# Patient Record
Sex: Male | Born: 1965
Health system: Southern US, Community
[De-identification: ages and names within clinical notes are randomized; demographics above are authoritative.]

## PROBLEM LIST (undated history)

## (undated) DIAGNOSIS — C189 Malignant neoplasm of colon, unspecified: Secondary | ICD-10-CM

## (undated) DIAGNOSIS — E785 Hyperlipidemia, unspecified: Secondary | ICD-10-CM

---

## 2013-06-22 ENCOUNTER — Ambulatory Visit (INDEPENDENT_AMBULATORY_CARE_PROVIDER_SITE_OTHER): Payer: Managed Care, Other (non HMO) | Admitting: Sports Medicine

## 2013-06-22 ENCOUNTER — Ambulatory Visit (INDEPENDENT_AMBULATORY_CARE_PROVIDER_SITE_OTHER): Payer: Managed Care, Other (non HMO)

## 2013-06-22 ENCOUNTER — Encounter: Payer: Self-pay | Admitting: Sports Medicine

## 2013-06-22 VITALS — BP 145/92 | HR 81 | Wt 218.0 lb

## 2013-06-22 DIAGNOSIS — Z0000 Encounter for general adult medical examination without abnormal findings: Secondary | ICD-10-CM

## 2013-06-22 DIAGNOSIS — M25561 Pain in right knee: Secondary | ICD-10-CM

## 2013-06-22 DIAGNOSIS — J45909 Unspecified asthma, uncomplicated: Secondary | ICD-10-CM | POA: Insufficient documentation

## 2013-06-22 DIAGNOSIS — M25569 Pain in unspecified knee: Secondary | ICD-10-CM

## 2013-06-22 DIAGNOSIS — Z299 Encounter for prophylactic measures, unspecified: Secondary | ICD-10-CM

## 2013-06-22 DIAGNOSIS — M1710 Unilateral primary osteoarthritis, unspecified knee: Secondary | ICD-10-CM

## 2013-06-22 DIAGNOSIS — Z23 Encounter for immunization: Secondary | ICD-10-CM

## 2013-06-22 NOTE — Assessment & Plan Note (Signed)
X-rays, continue Aleve, on rehabilitation. Return as needed for this.

## 2013-06-22 NOTE — Assessment & Plan Note (Signed)
Tetanus and flu given today. Return for complete physical. Next line checking routine blood work.

## 2013-06-22 NOTE — Progress Notes (Signed)
  Subjective:    CC: Establish care.   HPI:  This is a very healthy 47 year old male, comes here to establish care. He does need a complete physical, this should include biometric screening and is for his work.  Bilateral knee pain: Localized in the anterior knees, aching, no morning stiffness, mild, persistent.  Preventive measure: Due for flu, and Tdap injections.  Past medical history, Surgical history, Family history not pertinant except as noted below, Social history, Allergies, and medications have been entered into the medical record, reviewed, and no changes needed.   Review of Systems: No headache, visual changes, nausea, vomiting, diarrhea, constipation, dizziness, abdominal pain, skin rash, fevers, chills, night sweats, swollen lymph nodes, weight loss, chest pain, body aches, joint swelling, muscle aches, shortness of breath, mood changes, visual or auditory hallucinations.  Objective:    General: Well Developed, well nourished, and in no acute distress.  Neuro: Alert and oriented x3, extra-ocular muscles intact, sensation grossly intact.  HEENT: Normocephalic, atraumatic, pupils equal round reactive to light, neck supple, no masses, no lymphadenopathy, thyroid nonpalpable.  Skin: Warm and dry, no rashes noted.  Cardiac: Regular rate and rhythm, no murmurs rubs or gallops.  Respiratory: Clear to auscultation bilaterally. Not using accessory muscles, speaking in full sentences.  Abdominal: Soft, nontender, nondistended, positive bowel sounds, no masses, no organomegaly.  Bilateral Knee: Normal to inspection with no erythema or effusion or obvious bony abnormalities. Palpation normal with no warmth, joint line tenderness, patellar tenderness, or condyle tenderness. ROM full in flexion and extension and lower leg rotation. Ligaments with solid consistent endpoints including ACL, PCL, LCL, MCL. Negative Mcmurray's, Apley's, and Thessalonian tests. Non painful patellar  compression. Patellar glide without crepitus. Patellar and quadriceps tendons unremarkable. Hamstring and quadriceps strength is normal.   X-rays were personally reviewed, there is mild bilateral patellofemoral, and tibiofemoral DJD.  Impression and Recommendations:    The patient was counselled, risk factors were discussed, anticipatory guidance given.

## 2013-06-22 NOTE — Assessment & Plan Note (Signed)
Uses Advair and albuterol but only during allergy season. Asymptomatic the rest of the year.

## 2013-07-02 LAB — COMPREHENSIVE METABOLIC PANEL
ALT: 47 U/L (ref 0–53)
CO2: 28 mEq/L (ref 19–32)
Calcium: 8.9 mg/dL (ref 8.4–10.5)
Chloride: 102 mEq/L (ref 96–112)
Creat: 0.99 mg/dL (ref 0.50–1.35)
Glucose, Bld: 95 mg/dL (ref 70–99)
Sodium: 136 mEq/L (ref 135–145)
Total Protein: 6.9 g/dL (ref 6.0–8.3)

## 2013-07-02 LAB — LIPID PANEL
Cholesterol: 155 mg/dL (ref 0–200)
HDL: 37 mg/dL — ABNORMAL LOW (ref >39)
LDL Cholesterol: 102 mg/dL — ABNORMAL HIGH (ref 0–99)
Total CHOL/HDL Ratio: 4.2 Ratio
Triglycerides: 80 mg/dL (ref <150)
VLDL: 16 mg/dL (ref 0–40)

## 2013-07-02 LAB — COMPREHENSIVE METABOLIC PANEL WITH GFR
AST: 49 U/L — ABNORMAL HIGH (ref 0–37)
Albumin: 4.1 g/dL (ref 3.5–5.2)
Alkaline Phosphatase: 45 U/L (ref 39–117)
BUN: 13 mg/dL (ref 6–23)
Potassium: 4.3 meq/L (ref 3.5–5.3)
Total Bilirubin: 0.6 mg/dL (ref 0.3–1.2)

## 2013-07-02 LAB — CBC
HCT: 42.7 % (ref 39.0–52.0)
Hemoglobin: 15 g/dL (ref 13.0–17.0)
MCH: 30.9 pg (ref 26.0–34.0)
MCHC: 35.1 g/dL (ref 30.0–36.0)
MCV: 87.9 fL (ref 78.0–100.0)
Platelets: 240 10*3/uL (ref 150–400)
RBC: 4.86 MIL/uL (ref 4.22–5.81)
RDW: 13.8 % (ref 11.5–15.5)
WBC: 4.6 10*3/uL (ref 4.0–10.5)

## 2013-07-02 LAB — TSH: TSH: 1.351 u[IU]/mL (ref 0.350–4.500)

## 2013-07-03 ENCOUNTER — Ambulatory Visit (INDEPENDENT_AMBULATORY_CARE_PROVIDER_SITE_OTHER): Payer: Managed Care, Other (non HMO) | Admitting: Sports Medicine

## 2013-07-03 ENCOUNTER — Encounter: Payer: Self-pay | Admitting: Sports Medicine

## 2013-07-03 VITALS — BP 133/85 | HR 77 | Wt 217.0 lb

## 2013-07-03 DIAGNOSIS — Z0000 Encounter for general adult medical examination without abnormal findings: Secondary | ICD-10-CM

## 2013-07-03 DIAGNOSIS — M25561 Pain in right knee: Secondary | ICD-10-CM

## 2013-07-03 DIAGNOSIS — J45909 Unspecified asthma, uncomplicated: Secondary | ICD-10-CM

## 2013-07-03 DIAGNOSIS — Z299 Encounter for prophylactic measures, unspecified: Secondary | ICD-10-CM

## 2013-07-03 DIAGNOSIS — M25569 Pain in unspecified knee: Secondary | ICD-10-CM

## 2013-07-03 NOTE — Assessment & Plan Note (Signed)
Complete physical performed today. Lipids look good. Up-to-date on tetanus and flu vaccination. Biometric screening form filled out today.

## 2013-07-03 NOTE — Progress Notes (Signed)
  Subjective:    CC: Complete physical  HPI:  Preventive measure: Jared Singleton is up-to-date on all preventive measures including vaccinations. He is here for a complete physical exam. He does have a work by records form that needs to be filled out.  Knee pain: Stable with naproxen, has not yet started exercises.  Allergic asthma colon on Advair and albuterol, does not need any refills.  Past medical history, Surgical history, Family history not pertinant except as noted below, Social history, Allergies, and medications have been entered into the medical record, reviewed, and no changes needed.   Review of Systems: No headache, visual changes, nausea, vomiting, diarrhea, constipation, dizziness, abdominal pain, skin rash, fevers, chills, night sweats, swollen lymph nodes, weight loss, chest pain, body aches, joint swelling, muscle aches, shortness of breath, mood changes, visual or auditory hallucinations.  Objective:    General: Well Developed, well nourished, and in no acute distress.  Neuro: Alert and oriented x3, extra-ocular muscles intact, sensation grossly intact.  HEENT: Normocephalic, atraumatic, pupils equal round reactive to light, neck supple, no masses, no lymphadenopathy, thyroid nonpalpable. Oropharynx, nasopharynx, external ear canals are unremarkable. Skin: Warm and dry, no rashes noted.  Cardiac: Regular rate and rhythm, no murmurs rubs or gallops.  Respiratory: Clear to auscultation bilaterally. Not using accessory muscles, speaking in full sentences.  Abdominal: Soft, nontender, nondistended, positive bowel sounds, no masses, no organomegaly.  Musculoskeletal: Shoulder, elbow, wrist, hip, knee, ankle stable, and with full range of motion. Impression and Recommendations:    The patient was counselled, risk factors were discussed, anticipatory guidance given.

## 2013-07-03 NOTE — Assessment & Plan Note (Signed)
Well controlled. He is on Advair and albuterol, he will let us know his dosages when he is ready for refills.

## 2013-07-03 NOTE — Assessment & Plan Note (Signed)
Continue Aleve. Home exercises. He did have mild patellofemoral DJD on x-ray.

## 2013-12-11 ENCOUNTER — Encounter: Payer: Self-pay | Admitting: Sports Medicine

## 2013-12-11 ENCOUNTER — Ambulatory Visit (INDEPENDENT_AMBULATORY_CARE_PROVIDER_SITE_OTHER): Payer: BC Managed Care – PPO | Admitting: Sports Medicine

## 2013-12-11 VITALS — BP 131/83 | HR 73 | Ht 69.0 in | Wt 215.0 lb

## 2013-12-11 DIAGNOSIS — M25562 Pain in left knee: Secondary | ICD-10-CM

## 2013-12-11 DIAGNOSIS — Z299 Encounter for prophylactic measures, unspecified: Secondary | ICD-10-CM

## 2013-12-11 DIAGNOSIS — M25561 Pain in right knee: Secondary | ICD-10-CM

## 2013-12-11 DIAGNOSIS — M25569 Pain in unspecified knee: Secondary | ICD-10-CM

## 2013-12-11 DIAGNOSIS — R55 Syncope and collapse: Secondary | ICD-10-CM

## 2013-12-11 DIAGNOSIS — J45909 Unspecified asthma, uncomplicated: Secondary | ICD-10-CM

## 2013-12-11 MED ORDER — ALBUTEROL SULFATE HFA 108 (90 BASE) MCG/ACT IN AERS: 2.0000 | INHALATION_SPRAY | Freq: Four times a day (QID) | RESPIRATORY_TRACT | Status: DC | PRN

## 2013-12-11 MED ORDER — FLUTICASONE PROPIONATE HFA 110 MCG/ACT IN AERO: 2.0000 | INHALATION_SPRAY | Freq: Two times a day (BID) | RESPIRATORY_TRACT | Status: DC

## 2013-12-11 NOTE — Assessment & Plan Note (Signed)
This is likely benign and self-limited. He is able to run for 30 minutes on a treadmill without any cardiac or vascular symptoms. Return as needed.

## 2013-12-11 NOTE — Assessment & Plan Note (Signed)
Well controlled on Flovent, hasn't had to use any albuterol.

## 2013-12-11 NOTE — Assessment & Plan Note (Signed)
Return as needed for this, this is stable. He can come back for custom orthotics should he desire.

## 2013-12-11 NOTE — Assessment & Plan Note (Signed)
Return in November for complete physical.

## 2013-12-11 NOTE — Progress Notes (Signed)
  Subjective:    CC: Followup  HPI: Asthma: Well controlled on Flovent.  Knee pain: Overall well controlled with NSAIDs.  Vagal attack: Some time ago had an episode of presyncope, clamminess, nausea, this resolved quickly. He does run 30 minutes of treadmill and denies any chest pain, palpitations, or presyncopal symptoms. This has not happened again.  Past medical history, Surgical history, Family history not pertinant except as noted below, Social history, Allergies, and medications have been entered into the medical record, reviewed, and no changes needed.   Review of Systems: No fevers, chills, night sweats, weight loss, chest pain, or shortness of breath.   Objective:    General: Well Developed, well nourished, and in no acute distress.  Neuro: Alert and oriented x3, extra-ocular muscles intact, sensation grossly intact.  HEENT: Normocephalic, atraumatic, pupils equal round reactive to light, neck supple, no masses, no lymphadenopathy, thyroid nonpalpable.  Skin: Warm and dry, no rashes. Cardiac: Regular rate and rhythm, no murmurs rubs or gallops, no lower extremity edema.  Respiratory: Clear to auscultation bilaterally. Not using accessory muscles, speaking in full sentences.  Impression and Recommendations:

## 2013-12-11 NOTE — Patient Instructions (Signed)
Vasovagal Syncope, Adult  Syncope, commonly known as fainting, is a temporary loss of consciousness. It occurs when the blood flow to the brain is reduced. Vasovagal syncope (also called neurocardiogenic syncope) is a fainting spell in which the blood flow to the brain is reduced because of a sudden drop in heart rate and blood pressure. Vasovagal syncope occurs when the brain and the cardiovascular system (blood vessels) do not adequately communicate and respond to each other. This is the most common cause of fainting. It often occurs in response to fear or some other type of emotional or physical stress. The body has a reaction in which the heart starts beating too slowly or the blood vessels expand, reducing blood pressure. This type of fainting spell is generally considered harmless. However, injuries can occur if a person takes a sudden fall during a fainting spell.   CAUSES   Vasovagal syncope occurs when a person's blood pressure and heart rate decrease suddenly, usually in response to a trigger. Many things and situations can trigger an episode. Some of these include:   · Pain.    · Fear.    · The sight of blood or medical procedures, such as blood being drawn from a vein.    · Common activities, such as coughing, swallowing, stretching, or going to the bathroom.    · Emotional stress.    · Prolonged standing, especially in a warm environment.    · Lack of sleep or rest.    · Prolonged lack of food.    · Prolonged lack of fluids.    · Recent illness.  · The use of certain drugs that affect blood pressure, such as cocaine, alcohol, marijuana, inhalants, and opiates.    SYMPTOMS   Before the fainting episode, you may:   · Feel dizzy or light headed.    · Become pale.  · Sense that you are going to faint.    · Feel like the room is spinning.    · Have tunnel vision, only seeing directly in front of you.    · Feel sick to your stomach (nauseous).    · See spots or slowly lose vision.    · Hear ringing in your  ears.    · Have a headache.    · Feel warm and sweaty.    · Feel a sensation of pins and needles.  During the fainting spell, you will generally be unconscious for no longer than a couple minutes before waking up and returning to normal. If you get up too quickly before your body can recover, you may faint again. Some twitching or jerky movements may occur during the fainting spell.   DIAGNOSIS   Your caregiver will ask about your symptoms, take a medical history, and perform a physical exam. Various tests may be done to rule out other causes of fainting. These may include blood tests and tests to check the heart, such as electrocardiography, echocardiography, and possibly an electrophysiology study. When other causes have been ruled out, a test may be done to check the body's response to changes in position (tilt table test).  TREATMENT   Most cases of vasovagal syncope do not require treatment. Your caregiver may recommend ways to avoid fainting triggers and may provide home strategies for preventing fainting. If you must be exposed to a possible trigger, you can drink additional fluids to help reduce your chances of having an episode of vasovagal syncope. If you have warning signs of an oncoming episode, you can respond by positioning yourself favorably (lying down).  If your fainting spells continue, you may be   given medicines to prevent fainting. Some medicines may help make you more resistant to repeated episodes of vasovagal syncope. Special exercises or compression stockings may be recommended. In rare cases, the surgical placement of a pacemaker is considered.  HOME CARE INSTRUCTIONS   · Learn to identify the warning signs of vasovagal syncope.    · Sit or lie down at the first warning sign of a fainting spell. If sitting, put your head down between your legs. If you lie down, swing your legs up in the air to increase blood flow to the brain.    · Avoid hot tubs and saunas.  · Avoid prolonged  standing.  · Drink enough fluids to keep your urine clear or pale yellow. Avoid caffeine.  · Increase salt in your diet as directed by your caregiver.    · If you have to stand for a long time, perform movements such as:    · Crossing your legs.    · Flexing and stretching your leg muscles.    · Squatting.    · Moving your legs.    · Bending over.    · Only take over-the-counter or prescription medicines as directed by your caregiver. Do not suddenly stop any medicines without asking your caregiver first.   SEEK MEDICAL CARE IF:   · Your fainting spells continue or happen more frequently in spite of treatment.    · You lose consciousness for more than a couple minutes.  · You have fainting spells during or after exercising or after being startled.    · You have new symptoms that occur with the fainting spells, such as:    · Shortness of breath.  · Chest pain.    · Irregular heartbeat.    · You have episodes of twitching or jerky movements that last longer than a few seconds.  · You have episodes of twitching or jerky movements without obvious fainting.  SEEK IMMEDIATE MEDICAL CARE IF:   · You have injuries or bleeding after a fainting spell.    · You have episodes of twitching or jerky movements that last longer than 5 minutes.    · You have more than one spell of twitching or jerky movements before returning to consciousness after fainting.  MAKE SURE YOU:   · Understand these instructions.  · Will watch your condition.  · Will get help right away if you are not doing well or get worse.  Document Released: 07/26/2012 Document Reviewed: 07/26/2012  [employer]® Patient Information ©2014 [employer].

## 2014-07-23 ENCOUNTER — Encounter: Payer: Self-pay | Admitting: Sports Medicine

## 2014-07-23 ENCOUNTER — Ambulatory Visit (INDEPENDENT_AMBULATORY_CARE_PROVIDER_SITE_OTHER): Payer: BC Managed Care – PPO | Admitting: Sports Medicine

## 2014-07-23 VITALS — BP 135/84 | HR 70 | Ht 69.0 in | Wt 214.0 lb

## 2014-07-23 DIAGNOSIS — M25512 Pain in left shoulder: Secondary | ICD-10-CM | POA: Diagnosis not present

## 2014-07-23 DIAGNOSIS — M25511 Pain in right shoulder: Secondary | ICD-10-CM

## 2014-07-23 DIAGNOSIS — H6120 Impacted cerumen, unspecified ear: Secondary | ICD-10-CM | POA: Insufficient documentation

## 2014-07-23 DIAGNOSIS — Z0000 Encounter for general adult medical examination without abnormal findings: Secondary | ICD-10-CM | POA: Diagnosis not present

## 2014-07-23 DIAGNOSIS — H6121 Impacted cerumen, right ear: Secondary | ICD-10-CM

## 2014-07-23 LAB — COMPREHENSIVE METABOLIC PANEL WITH GFR
ALT: 42 U/L (ref 0–53)
AST: 22 U/L (ref 0–37)
Albumin: 4.3 g/dL (ref 3.5–5.2)
Alkaline Phosphatase: 48 U/L (ref 39–117)
Sodium: 139 meq/L (ref 135–145)
Total Protein: 7.3 g/dL (ref 6.0–8.3)

## 2014-07-23 LAB — COMPREHENSIVE METABOLIC PANEL
BUN: 11 mg/dL (ref 6–23)
CO2: 27 mEq/L (ref 19–32)
Calcium: 9.2 mg/dL (ref 8.4–10.5)
Chloride: 103 mEq/L (ref 96–112)
Creat: 1.02 mg/dL (ref 0.50–1.35)
Glucose, Bld: 84 mg/dL (ref 70–99)
Potassium: 4.5 mEq/L (ref 3.5–5.3)
Total Bilirubin: 0.5 mg/dL (ref 0.2–1.2)

## 2014-07-23 NOTE — Assessment & Plan Note (Signed)
Normal physical. Slight transaminitis on previous test, rechecking liver function.

## 2014-07-23 NOTE — Assessment & Plan Note (Signed)
Significant pruritus. Irrigation as above. As otomycosis is in the differential we can recheck this in one month when he comes back.

## 2014-07-23 NOTE — Assessment & Plan Note (Signed)
Home rehabilitation exercises given. Return in one month for injection if no better.

## 2014-07-23 NOTE — Progress Notes (Signed)
  Subjective:    CC: Complete physical   HPI:  Asthma: Well controlled.  Bilateral shoulder pain: Moderate, persistent, localized over the deltoid and worse with overhead activities, no trauma.  Knee pain: Well controlled.  Preventative measures: Here for a physical.  Past medical history, Surgical history, Family history not pertinant except as noted below, Social history, Allergies, and medications have been entered into the medical record, reviewed, and no changes needed.   Review of Systems: No headache, visual changes, nausea, vomiting, diarrhea, constipation, dizziness, abdominal pain, skin rash, fevers, chills, night sweats, swollen lymph nodes, weight loss, chest pain, body aches, joint swelling, muscle aches, shortness of breath, mood changes, visual or auditory hallucinations.  Objective:    General: Well Developed, well nourished, and in no acute distress.  Neuro: Alert and oriented x3, extra-ocular muscles intact, sensation grossly intact.  HEENT: Normocephalic, atraumatic, pupils equal round reactive to light, neck supple, no masses, no lymphadenopathy, thyroid nonpalpable. Right-sided cerumen impaction removed with irrigation without curettage, there were some whitish plaques in the canal Skin: Warm and dry, no rashes noted.  Cardiac: Regular rate and rhythm, no murmurs rubs or gallops.  Respiratory: Clear to auscultation bilaterally. Not using accessory muscles, speaking in full sentences.  Abdominal: Soft, nontender, nondistended, positive bowel sounds, no masses, no organomegaly.  Bilateral Shoulder: Inspection reveals no abnormalities, atrophy or asymmetry. Palpation is normal with no tenderness over AC joint or bicipital groove. ROM is full in all planes. Rotator cuff strength normal throughout. Positive Neer and Hawkin's tests, empty can. Speeds and Yergason's tests normal. No labral pathology noted with negative Obrien's, negative crank, negative clunk, and good  stability. Normal scapular function observed. No painful arc and no drop arm sign. No apprehension sign  Impression and Recommendations:    The patient was counselled, risk factors were discussed, anticipatory guidance given.

## 2014-08-20 ENCOUNTER — Ambulatory Visit: Payer: BC Managed Care – PPO | Admitting: Sports Medicine

## 2014-08-20 DIAGNOSIS — Z0289 Encounter for other administrative examinations: Secondary | ICD-10-CM

## 2014-08-27 ENCOUNTER — Encounter: Payer: BC Managed Care – PPO | Admitting: Sports Medicine

## 2015-07-29 ENCOUNTER — Other Ambulatory Visit: Payer: Self-pay | Admitting: Sports Medicine

## 2015-07-29 ENCOUNTER — Telehealth: Payer: Self-pay | Admitting: Sports Medicine

## 2015-07-29 DIAGNOSIS — Z1329 Encounter for screening for other suspected endocrine disorder: Secondary | ICD-10-CM

## 2015-07-29 DIAGNOSIS — Z0000 Encounter for general adult medical examination without abnormal findings: Secondary | ICD-10-CM

## 2015-07-29 DIAGNOSIS — Z13220 Encounter for screening for lipoid disorders: Secondary | ICD-10-CM

## 2015-07-29 NOTE — Telephone Encounter (Signed)
Orders placed.

## 2015-07-29 NOTE — Telephone Encounter (Signed)
Will route to Provider for review on what labs to order.

## 2015-07-29 NOTE — Telephone Encounter (Signed)
Pt has a physical coming up on 12/08 and he would like to have labs drawn prior.  Thank you.

## 2015-07-29 NOTE — Addendum Note (Signed)
Addended by: Silverio Decamp on: 07/29/2015 05:23 PM   Modules accepted: Orders

## 2015-07-30 LAB — CBC
HCT: 45.2 % (ref 39.0–52.0)
Hemoglobin: 15.4 g/dL (ref 13.0–17.0)
MCH: 30.2 pg (ref 26.0–34.0)
MCHC: 34.1 g/dL (ref 30.0–36.0)
MCV: 88.6 fL (ref 78.0–100.0)
MPV: 9.1 fL (ref 8.6–12.4)
Platelets: 291 10*3/uL (ref 150–400)
RBC: 5.1 MIL/uL (ref 4.22–5.81)
RDW: 13.6 % (ref 11.5–15.5)
WBC: 6 10*3/uL (ref 4.0–10.5)

## 2015-07-30 LAB — LIPID PANEL
Cholesterol: 167 mg/dL (ref 125–200)
HDL: 35 mg/dL — ABNORMAL LOW (ref >=40)
LDL Cholesterol: 113 mg/dL (ref <130)
Total CHOL/HDL Ratio: 4.8 Ratio (ref <=5.0)
Triglycerides: 97 mg/dL (ref <150)
VLDL: 19 mg/dL (ref <30)

## 2015-07-30 LAB — COMPREHENSIVE METABOLIC PANEL
ALT: 42 U/L (ref 9–46)
AST: 22 U/L (ref 10–40)
Albumin: 3.9 g/dL (ref 3.6–5.1)
BUN: 12 mg/dL (ref 7–25)
CO2: 24 mmol/L (ref 20–31)
Chloride: 102 mmol/L (ref 98–110)
Creat: 0.83 mg/dL (ref 0.60–1.35)
Glucose, Bld: 86 mg/dL (ref 65–99)
Sodium: 137 mmol/L (ref 135–146)
Total Bilirubin: 0.5 mg/dL (ref 0.2–1.2)

## 2015-07-30 LAB — COMPREHENSIVE METABOLIC PANEL WITH GFR
Alkaline Phosphatase: 44 U/L (ref 40–115)
Calcium: 9 mg/dL (ref 8.6–10.3)
Potassium: 4.5 mmol/L (ref 3.5–5.3)
Total Protein: 7.1 g/dL (ref 6.1–8.1)

## 2015-07-30 LAB — TSH: TSH: 0.928 u[IU]/mL (ref 0.350–4.500)

## 2015-07-31 ENCOUNTER — Encounter: Payer: Self-pay | Admitting: Sports Medicine

## 2015-07-31 ENCOUNTER — Ambulatory Visit (INDEPENDENT_AMBULATORY_CARE_PROVIDER_SITE_OTHER): Payer: BLUE CROSS/BLUE SHIELD | Admitting: Sports Medicine

## 2015-07-31 VITALS — BP 130/83 | HR 62 | Temp 97.8°F | Resp 16 | Ht 69.0 in | Wt 210.4 lb

## 2015-07-31 DIAGNOSIS — Z0000 Encounter for general adult medical examination without abnormal findings: Secondary | ICD-10-CM

## 2015-07-31 DIAGNOSIS — H60541 Acute eczematoid otitis externa, right ear: Secondary | ICD-10-CM | POA: Diagnosis not present

## 2015-07-31 LAB — VITAMIN D 25 HYDROXY (VIT D DEFICIENCY, FRACTURES): Vit D, 25-Hydroxy: 23 ng/mL — ABNORMAL LOW (ref 30–100)

## 2015-07-31 LAB — HEMOGLOBIN A1C
Hgb A1c MFr Bld: 5.5 % (ref <5.7)
Mean Plasma Glucose: 111 mg/dL (ref <117)

## 2015-07-31 MED ORDER — FLUOCINOLONE ACETONIDE 0.01 % OT OIL: 5.0000 [drp] | TOPICAL_OIL | Freq: Two times a day (BID) | OTIC | Status: DC | PRN

## 2015-07-31 MED ORDER — VITAMIN D (ERGOCALCIFEROL) 1.25 MG (50000 UNIT) PO CAPS: 50000.0000 [IU] | ORAL_CAPSULE | ORAL | Status: DC

## 2015-07-31 NOTE — Assessment & Plan Note (Signed)
Irrigation as above, also adding otic fluocinonide.

## 2015-07-31 NOTE — Addendum Note (Signed)
Addended by: Silverio Decamp on: 07/31/2015 09:11 AM   Modules accepted: Orders

## 2015-07-31 NOTE — Progress Notes (Signed)
  Subjective:    CC: physical exam  HPI:  This is a pleasant 49 year old male comes in for his annual physical, he has no complaints with the exception of persistent occasional pruritus in his right ear canal. Otherwise with happy with how things are going.  Past medical history, Surgical history, Family history not pertinant except as noted below, Social history, Allergies, and medications have been entered into the medical record, reviewed, and no changes needed.   Review of Systems: No headache, visual changes, nausea, vomiting, diarrhea, constipation, dizziness, abdominal pain, skin rash, fevers, chills, night sweats, swollen lymph nodes, weight loss, chest pain, body aches, joint swelling, muscle aches, shortness of breath, mood changes, visual or auditory hallucinations.  Objective:    General: Well Developed, well nourished, and in no acute distress.  Neuro: Alert and oriented x3, extra-ocular muscles intact, sensation grossly intact. Cranial nerves II through XII are intact, motor, sensory, and coordinative functions are all intact. HEENT: Normocephalic, atraumatic, pupils equal round reactive to light, neck supple, no masses, no lymphadenopathy, thyroid nonpalpable. Oropharynx, nasopharynx, external ear canals are unremarkable. Skin: Warm and dry, no rashes noted.  Cardiac: Regular rate and rhythm, no murmurs rubs or gallops.  Respiratory: Clear to auscultation bilaterally. Not using accessory muscles, speaking in full sentences.  Abdominal: Soft, nontender, nondistended, positive bowel sounds, no masses, no organomegaly.  Musculoskeletal: Shoulder, elbow, wrist, hip, knee, ankle stable, and with full range of motion.  Irrigation was performed by nurse of the right external ear canal, indications were cerumen impaction and otic eczema.  Impression and Recommendations:    The patient was counselled, risk factors were discussed, anticipatory guidance given.

## 2015-07-31 NOTE — Assessment & Plan Note (Addendum)
Complete physical as above, blood work was unremarkable. Return in one year for another complete physical.

## 2015-11-02 ENCOUNTER — Other Ambulatory Visit: Payer: Self-pay | Admitting: Sports Medicine

## 2015-11-03 ENCOUNTER — Other Ambulatory Visit: Payer: Self-pay | Admitting: Sports Medicine

## 2015-11-03 MED ORDER — ALBUTEROL SULFATE HFA 108 (90 BASE) MCG/ACT IN AERS: 2.0000 | INHALATION_SPRAY | Freq: Four times a day (QID) | RESPIRATORY_TRACT | Status: DC | PRN

## 2015-11-03 NOTE — Addendum Note (Signed)
Addended by: Silverio Decamp on: 11/03/2015 09:31 AM   Modules accepted: Orders

## 2016-07-30 ENCOUNTER — Encounter: Payer: BLUE CROSS/BLUE SHIELD | Admitting: Sports Medicine

## 2016-08-02 ENCOUNTER — Ambulatory Visit (INDEPENDENT_AMBULATORY_CARE_PROVIDER_SITE_OTHER): Payer: BLUE CROSS/BLUE SHIELD | Admitting: Sports Medicine

## 2016-08-02 ENCOUNTER — Ambulatory Visit (INDEPENDENT_AMBULATORY_CARE_PROVIDER_SITE_OTHER): Payer: BLUE CROSS/BLUE SHIELD

## 2016-08-02 VITALS — BP 131/89 | HR 73 | Wt 211.0 lb

## 2016-08-02 DIAGNOSIS — M5412 Radiculopathy, cervical region: Secondary | ICD-10-CM | POA: Diagnosis not present

## 2016-08-02 DIAGNOSIS — Z1211 Encounter for screening for malignant neoplasm of colon: Secondary | ICD-10-CM | POA: Diagnosis not present

## 2016-08-02 DIAGNOSIS — M5010 Cervical disc disorder with radiculopathy, unspecified cervical region: Secondary | ICD-10-CM | POA: Diagnosis not present

## 2016-08-02 DIAGNOSIS — M4802 Spinal stenosis, cervical region: Secondary | ICD-10-CM | POA: Diagnosis not present

## 2016-08-02 DIAGNOSIS — Z0000 Encounter for general adult medical examination without abnormal findings: Secondary | ICD-10-CM | POA: Diagnosis not present

## 2016-08-02 LAB — POC HEMOCCULT BLD/STL (OFFICE/1-CARD/DIAGNOSTIC)
Card #1 Date: 121117
Fecal Occult Blood, POC: NEGATIVE

## 2016-08-02 NOTE — Assessment & Plan Note (Signed)
Physical as above, routine blood work ordered. Ordering ColoGuard. Return in one year.

## 2016-08-02 NOTE — Assessment & Plan Note (Signed)
Right-sided C8 distribution. Neck rehabilitation exercises given, cervical spine x-rays. Pain is not severe enough to warrant NSAIDs per patient.

## 2016-08-02 NOTE — Progress Notes (Signed)
  Subjective:    CC: Annual physical  HPI:  This is a pleasant 50 year old male, here for his physical. He is only complaint is occasional neck pain with radiation around the right periscapular region and occasionally down to the fourth and fifth fingers. Mild, intermittent.  Past medical history:  Negative.  See flowsheet/record as well for more information.  Surgical history: Negative.  See flowsheet/record as well for more information.  Family history: Negative.  See flowsheet/record as well for more information.  Social history: Negative.  See flowsheet/record as well for more information.  Allergies, and medications have been entered into the medical record, reviewed, and no changes needed.    Review of Systems: No headache, visual changes, nausea, vomiting, diarrhea, constipation, dizziness, abdominal pain, skin rash, fevers, chills, night sweats, swollen lymph nodes, weight loss, chest pain, body aches, joint swelling, muscle aches, shortness of breath, mood changes, visual or auditory hallucinations.  Objective:    General: Well Developed, well nourished, and in no acute distress.  Neuro: Alert and oriented x3, extra-ocular muscles intact, sensation grossly intact. Cranial nerves II through XII are intact, motor, sensory, and coordinative functions are all intact. HEENT: Normocephalic, atraumatic, pupils equal round reactive to light, neck supple, no masses, no lymphadenopathy, thyroid nonpalpable. Oropharynx, nasopharynx, external ear canals are unremarkable. Skin: Warm and dry, no rashes noted.  Cardiac: Regular rate and rhythm, no murmurs rubs or gallops.  Respiratory: Clear to auscultation bilaterally. Not using accessory muscles, speaking in full sentences.  Abdominal: Soft, nontender, nondistended, positive bowel sounds, no masses, no organomegaly.  Musculoskeletal: Shoulder, elbow, wrist, hip, knee, ankle stable, and with full range of motion. Rectal: Good tone, smooth  prostate, Hemoccult negative.  Impression and Recommendations:    The patient was counselled, risk factors were discussed, anticipatory guidance given.  Annual physical exam Physical as above, routine blood work ordered. Ordering ColoGuard. Return in one year.  Right cervical radiculopathy Right-sided C8 distribution. Neck rehabilitation exercises given, cervical spine x-rays. Pain is not severe enough to warrant NSAIDs per patient.

## 2016-11-19 ENCOUNTER — Other Ambulatory Visit: Payer: Self-pay | Admitting: Sports Medicine

## 2017-06-26 DIAGNOSIS — K529 Noninfective gastroenteritis and colitis, unspecified: Secondary | ICD-10-CM | POA: Diagnosis not present

## 2017-06-26 DIAGNOSIS — K7689 Other specified diseases of liver: Secondary | ICD-10-CM | POA: Diagnosis not present

## 2017-06-26 DIAGNOSIS — R1084 Generalized abdominal pain: Secondary | ICD-10-CM | POA: Diagnosis not present

## 2017-06-26 DIAGNOSIS — K56699 Other intestinal obstruction unspecified as to partial versus complete obstruction: Secondary | ICD-10-CM | POA: Diagnosis not present

## 2017-06-26 DIAGNOSIS — R109 Unspecified abdominal pain: Secondary | ICD-10-CM | POA: Diagnosis not present

## 2017-06-26 DIAGNOSIS — R197 Diarrhea, unspecified: Secondary | ICD-10-CM | POA: Diagnosis not present

## 2017-06-26 DIAGNOSIS — K760 Fatty (change of) liver, not elsewhere classified: Secondary | ICD-10-CM | POA: Diagnosis not present

## 2017-06-27 ENCOUNTER — Telehealth: Payer: Self-pay | Admitting: Sports Medicine

## 2017-06-27 ENCOUNTER — Ambulatory Visit (INDEPENDENT_AMBULATORY_CARE_PROVIDER_SITE_OTHER): Payer: BLUE CROSS/BLUE SHIELD | Admitting: Sports Medicine

## 2017-06-27 ENCOUNTER — Encounter: Payer: Self-pay | Admitting: Sports Medicine

## 2017-06-27 DIAGNOSIS — K580 Irritable bowel syndrome with diarrhea: Secondary | ICD-10-CM

## 2017-06-27 DIAGNOSIS — Z0000 Encounter for general adult medical examination without abnormal findings: Secondary | ICD-10-CM | POA: Diagnosis not present

## 2017-06-27 DIAGNOSIS — C184 Malignant neoplasm of transverse colon: Secondary | ICD-10-CM | POA: Insufficient documentation

## 2017-06-27 MED ORDER — DICYCLOMINE HCL 20 MG PO TABS: 20.0000 mg | ORAL_TABLET | Freq: Three times a day (TID) | ORAL | 3 refills | Status: DC

## 2017-06-27 MED ORDER — PSYLLIUM 58.6 % PO POWD: 1.0000 | Freq: Three times a day (TID) | ORAL | 12 refills | Status: DC

## 2017-06-27 NOTE — Assessment & Plan Note (Addendum)
Classic diarrhea predominant IBS with tenesmus. Fantastic improvement with Bentyl, refilling medication. Adding Metamucil 3 times per day. We discussed the pathophysiology as well as treatment of irritable bowel syndrome. He did have a CT of the abdomen and pelvis with IV contrast, CBC, CMP, urinalysis in the emergency department all of which were negative. Has an appointment coming up with gastroenterology, he did have some narrowing of the transverse colon consistent with peristalsis but I do suspect GI will want to scope him. He will return to see me for a physical.  Unfortunately the narrowing in the transverse colon initially suspected to be a peristaltic wave was actually a large adenocarcinoma, we will keep follow-up with gastroenterology, colorectal surgery and medical oncology.

## 2017-06-27 NOTE — Progress Notes (Addendum)
  Subjective:    CC: Follow-up  HPI: This is a pleasant and healthy 51 year old male, this weekend he developed severe abdominal pain, left lower quadrant with tenesmus, relieved with stooling, he tells me he is actually had similar symptoms for several months now.  Unfortunately he had an episode over the weekend that did not resolve, he presented to the emergency department, a CBC, CMP, lipase, urinalysis were unremarkable, CT of the abdomen and pelvis with IV contrast was done, overall negative as well with the exception of what appeared to be peristalsis through the transverse colon.  He was prescribed Bentyl by the emergency room provider appropriately, and referred to gastroenterology as well, his appointment is tomorrow.  He is here to discuss his visit and essentially for second opinion.  Typical symptoms are immediate tenesmus after eating with pain relieved by explosive stooling, symptoms have overall improved considerably with 3 times daily Bentyl.  No melena, hematochezia, hematemesis.  No rash, no constitutional symptoms.  Past medical history:  Negative.  See flowsheet/record as well for more information.  Surgical history: Negative.  See flowsheet/record as well for more information.  Family history: Negative.  See flowsheet/record as well for more information.  Social history: Negative.  See flowsheet/record as well for more information.  Allergies, and medications have been entered into the medical record, reviewed, and no changes needed.   Review of Systems: No fevers, chills, night sweats, weight loss, chest pain, or shortness of breath.   Objective:    General: Well Developed, well nourished, and in no acute distress.  Neuro: Alert and oriented x3, extra-ocular muscles intact, sensation grossly intact.  HEENT: Normocephalic, atraumatic, pupils equal round reactive to light, neck supple, no masses, no lymphadenopathy, thyroid nonpalpable.  Skin: Warm and dry, no  rashes. Cardiac: Regular rate and rhythm, no murmurs rubs or gallops, no lower extremity edema.  Respiratory: Clear to auscultation bilaterally. Not using accessory muscles, speaking in full sentences. Abdomen: Soft, nontender, nondistended, no bowel sounds, no palpable masses, no guarding, rigidity, rebound tenderness.  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 Fantastic improvement with Bentyl, refilling medication. Adding Metamucil 3 times per day. We discussed the pathophysiology as well as treatment of irritable bowel syndrome. He did have a CT of the abdomen and pelvis with IV contrast, CBC, CMP, urinalysis in the emergency department all of which were negative. Has an appointment coming up with gastroenterology, he did have some narrowing of the transverse colon consistent with peristalsis but I do suspect GI will want to scope him. He will return to see me for a physical.  Unfortunately the narrowing in the transverse colon initially suspected to be a peristaltic wave was actually a large adenocarcinoma, we will keep follow-up with gastroenterology, colorectal surgery and medical oncology.  Annual physical exam Adding routine labs, physical coming up.  ___________________________________________ Gwen Her. Dianah Field, M.D., ABFM., CAQSM. Primary Care and [place] Instructor of [place] of [facility] of Medicine

## 2017-06-27 NOTE — Patient Instructions (Signed)
Irritable Bowel Syndrome, Adult Irritable bowel syndrome (IBS) is not one specific disease. It is a group of symptoms that affects the organs responsible for digestion (gastrointestinal or GI tract). To regulate how your GI tract works, your body sends signals back and forth between your intestines and your brain. If you have IBS, there may be a problem with these signals. As a result, your GI tract does not function normally. Your intestines may become more sensitive and overreact to certain things. This is especially true when you eat certain foods or when you are under stress. There are four types of IBS. These may be determined based on the consistency of your stool:  IBS with diarrhea.  IBS with constipation.  Mixed IBS.  Unsubtyped IBS.  It is important to know which type of IBS you have. Some treatments are more likely to be helpful for certain types of IBS. What are the causes? The exact cause of IBS is not known. What increases the risk? You may have a higher risk of IBS if:  You are a woman.  You are younger than 51 years old.  You have a family history of IBS.  You have mental health problems.  You have had bacterial infection of your GI tract.  What are the signs or symptoms? Symptoms of IBS vary from person to person. The main symptom is abdominal pain or discomfort. Additional symptoms usually include one or more of the following:  Diarrhea, constipation, or both.  Abdominal swelling or bloating.  Feeling full or sick after eating a small or regular-size meal.  Frequent gas.  Mucus in the stool.  A feeling of having more stool left after a bowel movement.  Symptoms tend to come and go. They may be associated with stress, psychiatric conditions, or nothing at all. How is this diagnosed? There is no specific test to diagnose IBS. Your health care provider will make a diagnosis based on a physical exam, medical history, and your symptoms. You may have other  tests to rule out other conditions that may be causing your symptoms. These may include:  Blood tests.  X-rays.  CT scan.  Endoscopy and colonoscopy. This is a test in which your GI tract is viewed with a long, thin, flexible tube.  How is this treated? There is no cure for IBS, but treatment can help relieve symptoms. IBS treatment often includes:  Changes to your diet, such as: ? Eating more fiber. ? Avoiding foods that cause symptoms. ? Drinking more water. ? Eating regular, medium-sized portioned meals.  Medicines. These may include: ? Fiber supplements if you have constipation. ? Medicine to control diarrhea (antidiarrheal medicines). ? Medicine to help control muscle spasms in your GI tract (antispasmodic medicines). ? Medicines to help with any mental health issues, such as antidepressants or tranquilizers.  Therapy. ? Talk therapy may help with anxiety, depression, or other mental health issues that can make IBS symptoms worse.  Stress reduction. ? Managing your stress can help keep symptoms under control.  Follow these instructions at home:  Take medicines only as directed by your health care provider.  Eat a healthy diet. ? Avoid foods and drinks with added sugar. ? Include more whole grains, fruits, and vegetables gradually into your diet. This may be especially helpful if you have IBS with constipation. ? Avoid any foods and drinks that make your symptoms worse. These may include dairy products and caffeinated or carbonated drinks. ? Do not eat large meals. ? Drink enough   fluid to keep your urine clear or pale yellow.  Exercise regularly. Ask your health care provider for recommendations of good activities for you.  Keep all follow-up visits as directed by your health care provider. This is important. Contact a health care provider if:  You have constant pain.  You have trouble or pain with swallowing.  You have worsening diarrhea. Get help right away  if:  You have severe and worsening abdominal pain.  You have diarrhea and: ? You have a rash, stiff neck, or severe headache. ? You are irritable, sleepy, or difficult to awaken. ? You are weak, dizzy, or extremely thirsty.  You have bright red blood in your stool or you have black tarry stools.  You have unusual abdominal swelling that is painful.  You vomit continuously.  You vomit blood (hematemesis).  You have both abdominal pain and a fever. This information is not intended to replace advice given to you by your health care provider. Make sure you discuss any questions you have with your health care provider. Document Released: 08/09/2005 Document Revised: 01/09/2016 Document Reviewed: 04/26/2014 Elsevier Interactive Patient Education  2018 Elsevier Inc.  

## 2017-06-27 NOTE — Assessment & Plan Note (Signed)
Adding routine labs, physical coming up.

## 2017-06-28 ENCOUNTER — Ambulatory Visit: Payer: BLUE CROSS/BLUE SHIELD | Admitting: Sports Medicine

## 2017-06-28 DIAGNOSIS — R197 Diarrhea, unspecified: Secondary | ICD-10-CM | POA: Diagnosis not present

## 2017-06-28 DIAGNOSIS — R933 Abnormal findings on diagnostic imaging of other parts of digestive tract: Secondary | ICD-10-CM | POA: Diagnosis not present

## 2017-06-28 DIAGNOSIS — Z289 Immunization not carried out for unspecified reason: Secondary | ICD-10-CM | POA: Diagnosis not present

## 2017-06-28 DIAGNOSIS — Z0000 Encounter for general adult medical examination without abnormal findings: Secondary | ICD-10-CM | POA: Diagnosis not present

## 2017-06-29 LAB — LIPID PANEL W/REFLEX DIRECT LDL
Cholesterol: 173 mg/dL (ref <200)
HDL: 41 mg/dL (ref >40)
LDL Cholesterol (Calc): 114 mg/dL — ABNORMAL HIGH
Non-HDL Cholesterol (Calc): 132 mg/dL (calc) — ABNORMAL HIGH (ref <130)
Total CHOL/HDL Ratio: 4.2 (calc) (ref <5.0)
Triglycerides: 81 mg/dL (ref <150)

## 2017-06-29 LAB — COMPREHENSIVE METABOLIC PANEL WITH GFR
Albumin: 4.2 g/dL (ref 3.6–5.1)
BUN: 16 mg/dL (ref 7–25)
Creat: 1.18 mg/dL (ref 0.70–1.33)
Potassium: 4.3 mmol/L (ref 3.5–5.3)
Sodium: 139 mmol/L (ref 135–146)
Total Protein: 7.5 g/dL (ref 6.1–8.1)

## 2017-06-29 LAB — HIV ANTIBODY (ROUTINE TESTING W REFLEX): HIV 1&2 Ab, 4th Generation: NONREACTIVE

## 2017-06-29 LAB — COMPREHENSIVE METABOLIC PANEL
AG Ratio: 1.3 (calc) (ref 1.0–2.5)
ALT: 31 U/L (ref 9–46)
AST: 20 U/L (ref 10–35)
Alkaline phosphatase (APISO): 55 U/L (ref 40–115)
CO2: 27 mmol/L (ref 20–32)
Calcium: 9.5 mg/dL (ref 8.6–10.3)
Chloride: 103 mmol/L (ref 98–110)
Globulin: 3.3 g/dL (calc) (ref 1.9–3.7)
Glucose, Bld: 82 mg/dL (ref 65–99)
Total Bilirubin: 0.5 mg/dL (ref 0.2–1.2)

## 2017-06-29 LAB — CBC
HCT: 41.9 % (ref 38.5–50.0)
Hemoglobin: 14.3 g/dL (ref 13.2–17.1)
MCH: 30 pg (ref 27.0–33.0)
MCHC: 34.1 g/dL (ref 32.0–36.0)
MCV: 87.8 fL (ref 80.0–100.0)
MPV: 9.7 fL (ref 7.5–12.5)
Platelets: 320 10*3/uL (ref 140–400)
RBC: 4.77 10*6/uL (ref 4.20–5.80)
RDW: 13 % (ref 11.0–15.0)
WBC: 6.3 10*3/uL (ref 3.8–10.8)

## 2017-06-29 LAB — HEMOGLOBIN A1C
Hgb A1c MFr Bld: 5.1 %{Hb} (ref <5.7)
Mean Plasma Glucose: 100 (calc)
eAG (mmol/L): 5.5 (calc)

## 2017-06-29 LAB — TSH: TSH: 1.02 m[IU]/L (ref 0.40–4.50)

## 2017-07-05 DIAGNOSIS — K56600 Partial intestinal obstruction, unspecified as to cause: Secondary | ICD-10-CM | POA: Diagnosis not present

## 2017-07-05 DIAGNOSIS — D128 Benign neoplasm of rectum: Secondary | ICD-10-CM | POA: Diagnosis not present

## 2017-07-05 DIAGNOSIS — Z1211 Encounter for screening for malignant neoplasm of colon: Secondary | ICD-10-CM | POA: Diagnosis not present

## 2017-07-05 DIAGNOSIS — C184 Malignant neoplasm of transverse colon: Secondary | ICD-10-CM | POA: Diagnosis not present

## 2017-07-05 DIAGNOSIS — K621 Rectal polyp: Secondary | ICD-10-CM | POA: Diagnosis not present

## 2017-07-05 DIAGNOSIS — R197 Diarrhea, unspecified: Secondary | ICD-10-CM | POA: Diagnosis not present

## 2017-07-05 LAB — HM COLONOSCOPY

## 2017-07-06 DIAGNOSIS — K7689 Other specified diseases of liver: Secondary | ICD-10-CM | POA: Diagnosis not present

## 2017-07-06 DIAGNOSIS — R911 Solitary pulmonary nodule: Secondary | ICD-10-CM | POA: Diagnosis not present

## 2017-07-06 DIAGNOSIS — C189 Malignant neoplasm of colon, unspecified: Secondary | ICD-10-CM | POA: Diagnosis not present

## 2017-07-06 DIAGNOSIS — C184 Malignant neoplasm of transverse colon: Secondary | ICD-10-CM | POA: Diagnosis not present

## 2017-07-06 DIAGNOSIS — K760 Fatty (change of) liver, not elsewhere classified: Secondary | ICD-10-CM | POA: Diagnosis not present

## 2017-07-07 ENCOUNTER — Encounter: Payer: Self-pay | Admitting: Sports Medicine

## 2017-07-07 DIAGNOSIS — C184 Malignant neoplasm of transverse colon: Secondary | ICD-10-CM | POA: Diagnosis not present

## 2017-07-12 DIAGNOSIS — C184 Malignant neoplasm of transverse colon: Secondary | ICD-10-CM | POA: Diagnosis not present

## 2017-07-12 DIAGNOSIS — Z801 Family history of malignant neoplasm of trachea, bronchus and lung: Secondary | ICD-10-CM | POA: Diagnosis not present

## 2017-07-12 DIAGNOSIS — K639 Disease of intestine, unspecified: Secondary | ICD-10-CM | POA: Diagnosis not present

## 2017-07-28 DIAGNOSIS — J45909 Unspecified asthma, uncomplicated: Secondary | ICD-10-CM | POA: Diagnosis not present

## 2017-07-28 DIAGNOSIS — Z01818 Encounter for other preprocedural examination: Secondary | ICD-10-CM | POA: Diagnosis not present

## 2017-07-28 DIAGNOSIS — C184 Malignant neoplasm of transverse colon: Secondary | ICD-10-CM | POA: Diagnosis not present

## 2017-07-28 DIAGNOSIS — C189 Malignant neoplasm of colon, unspecified: Secondary | ICD-10-CM | POA: Diagnosis not present

## 2017-08-03 ENCOUNTER — Ambulatory Visit (INDEPENDENT_AMBULATORY_CARE_PROVIDER_SITE_OTHER): Payer: BLUE CROSS/BLUE SHIELD | Admitting: Sports Medicine

## 2017-08-03 ENCOUNTER — Encounter: Payer: Self-pay | Admitting: Sports Medicine

## 2017-08-03 VITALS — BP 121/77 | HR 76 | Resp 16 | Wt 214.0 lb

## 2017-08-03 DIAGNOSIS — Z0000 Encounter for general adult medical examination without abnormal findings: Secondary | ICD-10-CM | POA: Diagnosis not present

## 2017-08-03 DIAGNOSIS — C184 Malignant neoplasm of transverse colon: Secondary | ICD-10-CM | POA: Diagnosis not present

## 2017-08-03 DIAGNOSIS — Z01818 Encounter for other preprocedural examination: Secondary | ICD-10-CM | POA: Diagnosis not present

## 2017-08-03 NOTE — Assessment & Plan Note (Signed)
Large transverse colon neoplasm, hemicolectomy versus subtotal colectomy coming up on Monday. He has greater than 4 metabolic equivalents of exercise tolerance, ECG normal, recent labs normal. He is cleared for intermediate risk noncardiac surgery. I have wished him the best.

## 2017-08-03 NOTE — Assessment & Plan Note (Signed)
Annual physical as above.  

## 2017-08-03 NOTE — Progress Notes (Signed)
  Subjective:    CC: Annual physical exam  HPI:  This is a pleasant 51 year old male, he is here for his routine physical, he did have a recently diagnosed transverse colon neoplasm and has a subtotal versus hemicolectomy coming up on Monday.  Otherwise no complaints.  Past medical history:  Negative.  See flowsheet/record as well for more information.  Surgical history: Negative.  See flowsheet/record as well for more information.  Family history: Negative.  See flowsheet/record as well for more information.  Social history: Negative.  See flowsheet/record as well for more information.  Allergies, and medications have been entered into the medical record, reviewed, and no changes needed.    (To billers/coders, pertinent past medical, social, surgical, family history can be found in problem list, if problem list is marked as reviewed then this indicates that past medical, social, surgical, family history was also reviewed)  Review of Systems: No headache, visual changes, nausea, vomiting, diarrhea, constipation, dizziness, abdominal pain, skin rash, fevers, chills, night sweats, swollen lymph nodes, weight loss, chest pain, body aches, joint swelling, muscle aches, shortness of breath, mood changes, visual or auditory hallucinations.  Objective:    General: Well Developed, well nourished, and in no acute distress.  Neuro: Alert and oriented x3, extra-ocular muscles intact, sensation grossly intact. Cranial nerves II through XII are intact, motor, sensory, and coordinative functions are all intact. HEENT: Normocephalic, atraumatic, pupils equal round reactive to light, neck supple, no masses, no lymphadenopathy, thyroid nonpalpable. Oropharynx, nasopharynx, external ear canals are unremarkable. Skin: Warm and dry, no rashes noted.  Cardiac: Regular rate and rhythm, no murmurs rubs or gallops.  Respiratory: Clear to auscultation bilaterally. Not using accessory muscles, speaking in full  sentences.  Abdominal: Soft, nontender, nondistended, positive bowel sounds, no masses, no organomegaly.  Musculoskeletal: Shoulder, elbow, wrist, hip, knee, ankle stable, and with full range of motion.  Impression and Recommendations:    The patient was counselled, risk factors were discussed, anticipatory guidance given.  Annual physical exam Annual physical as above.  Malignant neoplasm of transverse colon (HCC) Large transverse colon neoplasm, hemicolectomy versus subtotal colectomy coming up on Monday. He has greater than 4 metabolic equivalents of exercise tolerance, ECG normal, recent labs normal. He is cleared for intermediate risk noncardiac surgery. I have wished him the best. ___________________________________________ Gwen Her. Dianah Field, M.D., ABFM., CAQSM. Primary Care and [place] Instructor of [place] of [facility] of Medicine

## 2017-08-03 NOTE — Addendum Note (Signed)
Addended by: Elizabeth Sauer on: 08/03/2017 11:19 AM   Modules accepted: Orders

## 2017-08-08 DIAGNOSIS — C189 Malignant neoplasm of colon, unspecified: Secondary | ICD-10-CM | POA: Diagnosis not present

## 2017-08-08 DIAGNOSIS — Z79899 Other long term (current) drug therapy: Secondary | ICD-10-CM | POA: Diagnosis not present

## 2017-08-08 DIAGNOSIS — G8918 Other acute postprocedural pain: Secondary | ICD-10-CM | POA: Diagnosis not present

## 2017-08-08 DIAGNOSIS — C772 Secondary and unspecified malignant neoplasm of intra-abdominal lymph nodes: Secondary | ICD-10-CM | POA: Diagnosis not present

## 2017-08-08 DIAGNOSIS — K658 Other peritonitis: Secondary | ICD-10-CM | POA: Diagnosis not present

## 2017-08-08 DIAGNOSIS — F419 Anxiety disorder, unspecified: Secondary | ICD-10-CM | POA: Diagnosis not present

## 2017-08-08 DIAGNOSIS — K9171 Accidental puncture and laceration of a digestive system organ or structure during a digestive system procedure: Secondary | ICD-10-CM | POA: Diagnosis not present

## 2017-08-08 DIAGNOSIS — E669 Obesity, unspecified: Secondary | ICD-10-CM | POA: Diagnosis not present

## 2017-08-08 DIAGNOSIS — J45909 Unspecified asthma, uncomplicated: Secondary | ICD-10-CM | POA: Diagnosis not present

## 2017-08-08 DIAGNOSIS — D120 Benign neoplasm of cecum: Secondary | ICD-10-CM | POA: Diagnosis not present

## 2017-08-08 DIAGNOSIS — Z9049 Acquired absence of other specified parts of digestive tract: Secondary | ICD-10-CM | POA: Diagnosis not present

## 2017-08-08 DIAGNOSIS — Z6831 Body mass index (BMI) 31.0-31.9, adult: Secondary | ICD-10-CM | POA: Diagnosis not present

## 2017-08-08 DIAGNOSIS — K9189 Other postprocedural complications and disorders of digestive system: Secondary | ICD-10-CM | POA: Diagnosis not present

## 2017-08-08 DIAGNOSIS — C184 Malignant neoplasm of transverse colon: Secondary | ICD-10-CM | POA: Diagnosis not present

## 2017-08-09 DIAGNOSIS — C184 Malignant neoplasm of transverse colon: Secondary | ICD-10-CM | POA: Diagnosis not present

## 2017-08-09 DIAGNOSIS — Z9049 Acquired absence of other specified parts of digestive tract: Secondary | ICD-10-CM | POA: Diagnosis not present

## 2017-08-09 DIAGNOSIS — K9189 Other postprocedural complications and disorders of digestive system: Secondary | ICD-10-CM | POA: Diagnosis not present

## 2017-08-09 DIAGNOSIS — K9171 Accidental puncture and laceration of a digestive system organ or structure during a digestive system procedure: Secondary | ICD-10-CM | POA: Diagnosis not present

## 2017-08-11 DIAGNOSIS — K631 Perforation of intestine (nontraumatic): Secondary | ICD-10-CM | POA: Insufficient documentation

## 2017-08-22 MED ORDER — PHENOL 1.4 % MT LIQD
OROMUCOSAL | Status: DC
Start: ? — End: 2017-08-22

## 2017-08-22 MED ORDER — DIPHENHYDRAMINE HCL 25 MG PO CAPS
25.00 | ORAL_CAPSULE | ORAL | Status: DC
Start: ? — End: 2017-08-22

## 2017-08-22 MED ORDER — ENOXAPARIN SODIUM 40 MG/0.4ML ~~LOC~~ SOLN
40.00 | SUBCUTANEOUS | Status: DC
Start: 2017-08-20 — End: 2017-08-22

## 2017-08-22 MED ORDER — SODIUM CHLORIDE 0.9 % IV SOLN
INTRAVENOUS | Status: DC
Start: ? — End: 2017-08-22

## 2017-08-22 MED ORDER — GENERIC EXTERNAL MEDICATION
Status: DC
Start: ? — End: 2017-08-22

## 2017-08-22 MED ORDER — BACLOFEN 10 MG PO TABS
10.00 | ORAL_TABLET | ORAL | Status: DC
Start: 2017-08-20 — End: 2017-08-22

## 2017-08-22 MED ORDER — TRAMADOL HCL 50 MG PO TABS
50.00 | ORAL_TABLET | ORAL | Status: DC
Start: ? — End: 2017-08-22

## 2017-08-22 MED ORDER — SODIUM CHLORIDE 0.9 % IV SOLN
1000.00 | INTRAVENOUS | Status: DC
Start: ? — End: 2017-08-22

## 2017-08-22 MED ORDER — LORAZEPAM 2 MG/ML IJ SOLN
0.50 | INTRAMUSCULAR | Status: DC
Start: ? — End: 2017-08-22

## 2017-08-22 MED ORDER — CHOLESTYRAMINE LIGHT 4 G PO PACK
4.00 | PACK | ORAL | Status: DC
Start: ? — End: 2017-08-22

## 2017-09-13 DIAGNOSIS — C772 Secondary and unspecified malignant neoplasm of intra-abdominal lymph nodes: Secondary | ICD-10-CM | POA: Diagnosis not present

## 2017-09-13 DIAGNOSIS — C184 Malignant neoplasm of transverse colon: Secondary | ICD-10-CM | POA: Diagnosis not present

## 2017-09-13 DIAGNOSIS — R911 Solitary pulmonary nodule: Secondary | ICD-10-CM | POA: Insufficient documentation

## 2017-09-13 DIAGNOSIS — T8189XD Other complications of procedures, not elsewhere classified, subsequent encounter: Secondary | ICD-10-CM | POA: Diagnosis not present

## 2017-09-13 DIAGNOSIS — Z5111 Encounter for antineoplastic chemotherapy: Secondary | ICD-10-CM | POA: Diagnosis not present

## 2017-09-14 DIAGNOSIS — C184 Malignant neoplasm of transverse colon: Secondary | ICD-10-CM | POA: Diagnosis not present

## 2017-09-20 DIAGNOSIS — C184 Malignant neoplasm of transverse colon: Secondary | ICD-10-CM | POA: Diagnosis not present

## 2017-09-23 DIAGNOSIS — Z006 Encounter for examination for normal comparison and control in clinical research program: Secondary | ICD-10-CM | POA: Diagnosis not present

## 2017-09-23 DIAGNOSIS — Z452 Encounter for adjustment and management of vascular access device: Secondary | ICD-10-CM | POA: Diagnosis not present

## 2017-09-23 DIAGNOSIS — C184 Malignant neoplasm of transverse colon: Secondary | ICD-10-CM | POA: Diagnosis not present

## 2017-09-23 DIAGNOSIS — C189 Malignant neoplasm of colon, unspecified: Secondary | ICD-10-CM | POA: Diagnosis not present

## 2017-09-26 DIAGNOSIS — C182 Malignant neoplasm of ascending colon: Secondary | ICD-10-CM | POA: Diagnosis not present

## 2017-09-26 DIAGNOSIS — C184 Malignant neoplasm of transverse colon: Secondary | ICD-10-CM | POA: Diagnosis not present

## 2017-09-26 DIAGNOSIS — Z5111 Encounter for antineoplastic chemotherapy: Secondary | ICD-10-CM | POA: Diagnosis not present

## 2017-09-28 DIAGNOSIS — C184 Malignant neoplasm of transverse colon: Secondary | ICD-10-CM | POA: Diagnosis not present

## 2017-09-28 DIAGNOSIS — Z5111 Encounter for antineoplastic chemotherapy: Secondary | ICD-10-CM | POA: Diagnosis not present

## 2017-10-03 DIAGNOSIS — Z5111 Encounter for antineoplastic chemotherapy: Secondary | ICD-10-CM | POA: Diagnosis not present

## 2017-10-03 DIAGNOSIS — C184 Malignant neoplasm of transverse colon: Secondary | ICD-10-CM | POA: Diagnosis not present

## 2017-10-12 DIAGNOSIS — Z5111 Encounter for antineoplastic chemotherapy: Secondary | ICD-10-CM | POA: Diagnosis not present

## 2017-10-12 DIAGNOSIS — C772 Secondary and unspecified malignant neoplasm of intra-abdominal lymph nodes: Secondary | ICD-10-CM | POA: Diagnosis not present

## 2017-10-12 DIAGNOSIS — C182 Malignant neoplasm of ascending colon: Secondary | ICD-10-CM | POA: Diagnosis not present

## 2017-10-12 DIAGNOSIS — C7989 Secondary malignant neoplasm of other specified sites: Secondary | ICD-10-CM | POA: Diagnosis not present

## 2017-10-12 DIAGNOSIS — C184 Malignant neoplasm of transverse colon: Secondary | ICD-10-CM | POA: Diagnosis not present

## 2017-10-12 DIAGNOSIS — R110 Nausea: Secondary | ICD-10-CM | POA: Diagnosis not present

## 2017-10-12 DIAGNOSIS — D500 Iron deficiency anemia secondary to blood loss (chronic): Secondary | ICD-10-CM | POA: Insufficient documentation

## 2017-10-14 DIAGNOSIS — C184 Malignant neoplasm of transverse colon: Secondary | ICD-10-CM | POA: Diagnosis not present

## 2017-10-14 DIAGNOSIS — Z5111 Encounter for antineoplastic chemotherapy: Secondary | ICD-10-CM | POA: Diagnosis not present

## 2017-10-19 DIAGNOSIS — Z5111 Encounter for antineoplastic chemotherapy: Secondary | ICD-10-CM | POA: Diagnosis not present

## 2017-10-19 DIAGNOSIS — C184 Malignant neoplasm of transverse colon: Secondary | ICD-10-CM | POA: Diagnosis not present

## 2017-10-24 DIAGNOSIS — C182 Malignant neoplasm of ascending colon: Secondary | ICD-10-CM | POA: Diagnosis not present

## 2017-10-26 DIAGNOSIS — R6889 Other general symptoms and signs: Secondary | ICD-10-CM | POA: Diagnosis not present

## 2017-10-26 DIAGNOSIS — R6884 Jaw pain: Secondary | ICD-10-CM | POA: Diagnosis not present

## 2017-10-26 DIAGNOSIS — T451X5A Adverse effect of antineoplastic and immunosuppressive drugs, initial encounter: Secondary | ICD-10-CM | POA: Diagnosis not present

## 2017-10-26 DIAGNOSIS — D500 Iron deficiency anemia secondary to blood loss (chronic): Secondary | ICD-10-CM | POA: Diagnosis not present

## 2017-10-26 DIAGNOSIS — Z5111 Encounter for antineoplastic chemotherapy: Secondary | ICD-10-CM | POA: Diagnosis not present

## 2017-10-26 DIAGNOSIS — K631 Perforation of intestine (nontraumatic): Secondary | ICD-10-CM | POA: Diagnosis not present

## 2017-10-26 DIAGNOSIS — C184 Malignant neoplasm of transverse colon: Secondary | ICD-10-CM | POA: Diagnosis not present

## 2017-10-26 DIAGNOSIS — R911 Solitary pulmonary nodule: Secondary | ICD-10-CM | POA: Diagnosis not present

## 2017-10-26 DIAGNOSIS — D701 Agranulocytosis secondary to cancer chemotherapy: Secondary | ICD-10-CM | POA: Diagnosis not present

## 2017-10-26 DIAGNOSIS — C182 Malignant neoplasm of ascending colon: Secondary | ICD-10-CM | POA: Diagnosis not present

## 2017-10-26 DIAGNOSIS — R110 Nausea: Secondary | ICD-10-CM | POA: Diagnosis not present

## 2017-10-28 DIAGNOSIS — K631 Perforation of intestine (nontraumatic): Secondary | ICD-10-CM | POA: Diagnosis not present

## 2017-10-28 DIAGNOSIS — R110 Nausea: Secondary | ICD-10-CM | POA: Diagnosis not present

## 2017-10-28 DIAGNOSIS — Z5111 Encounter for antineoplastic chemotherapy: Secondary | ICD-10-CM | POA: Diagnosis not present

## 2017-10-28 DIAGNOSIS — C184 Malignant neoplasm of transverse colon: Secondary | ICD-10-CM | POA: Diagnosis not present

## 2017-10-28 DIAGNOSIS — D500 Iron deficiency anemia secondary to blood loss (chronic): Secondary | ICD-10-CM | POA: Diagnosis not present

## 2017-10-28 DIAGNOSIS — T451X5A Adverse effect of antineoplastic and immunosuppressive drugs, initial encounter: Secondary | ICD-10-CM | POA: Diagnosis not present

## 2017-11-02 DIAGNOSIS — T451X5A Adverse effect of antineoplastic and immunosuppressive drugs, initial encounter: Secondary | ICD-10-CM | POA: Diagnosis not present

## 2017-11-02 DIAGNOSIS — Z5111 Encounter for antineoplastic chemotherapy: Secondary | ICD-10-CM | POA: Diagnosis not present

## 2017-11-02 DIAGNOSIS — K631 Perforation of intestine (nontraumatic): Secondary | ICD-10-CM | POA: Diagnosis not present

## 2017-11-02 DIAGNOSIS — R110 Nausea: Secondary | ICD-10-CM | POA: Diagnosis not present

## 2017-11-02 DIAGNOSIS — C184 Malignant neoplasm of transverse colon: Secondary | ICD-10-CM | POA: Diagnosis not present

## 2017-11-02 DIAGNOSIS — D500 Iron deficiency anemia secondary to blood loss (chronic): Secondary | ICD-10-CM | POA: Diagnosis not present

## 2017-11-09 DIAGNOSIS — T451X5A Adverse effect of antineoplastic and immunosuppressive drugs, initial encounter: Secondary | ICD-10-CM | POA: Diagnosis not present

## 2017-11-09 DIAGNOSIS — K631 Perforation of intestine (nontraumatic): Secondary | ICD-10-CM | POA: Diagnosis not present

## 2017-11-09 DIAGNOSIS — C182 Malignant neoplasm of ascending colon: Secondary | ICD-10-CM | POA: Diagnosis not present

## 2017-11-09 DIAGNOSIS — R6889 Other general symptoms and signs: Secondary | ICD-10-CM | POA: Diagnosis not present

## 2017-11-09 DIAGNOSIS — C184 Malignant neoplasm of transverse colon: Secondary | ICD-10-CM | POA: Diagnosis not present

## 2017-11-09 DIAGNOSIS — D500 Iron deficiency anemia secondary to blood loss (chronic): Secondary | ICD-10-CM | POA: Diagnosis not present

## 2017-11-09 DIAGNOSIS — R110 Nausea: Secondary | ICD-10-CM | POA: Diagnosis not present

## 2017-11-09 DIAGNOSIS — R911 Solitary pulmonary nodule: Secondary | ICD-10-CM | POA: Diagnosis not present

## 2017-11-09 DIAGNOSIS — D701 Agranulocytosis secondary to cancer chemotherapy: Secondary | ICD-10-CM | POA: Diagnosis not present

## 2017-11-09 DIAGNOSIS — Z5111 Encounter for antineoplastic chemotherapy: Secondary | ICD-10-CM | POA: Diagnosis not present

## 2017-11-11 DIAGNOSIS — Z5111 Encounter for antineoplastic chemotherapy: Secondary | ICD-10-CM | POA: Diagnosis not present

## 2017-11-11 DIAGNOSIS — T451X5A Adverse effect of antineoplastic and immunosuppressive drugs, initial encounter: Secondary | ICD-10-CM | POA: Diagnosis not present

## 2017-11-11 DIAGNOSIS — D500 Iron deficiency anemia secondary to blood loss (chronic): Secondary | ICD-10-CM | POA: Diagnosis not present

## 2017-11-11 DIAGNOSIS — C184 Malignant neoplasm of transverse colon: Secondary | ICD-10-CM | POA: Diagnosis not present

## 2017-11-11 DIAGNOSIS — R110 Nausea: Secondary | ICD-10-CM | POA: Diagnosis not present

## 2017-11-11 DIAGNOSIS — K631 Perforation of intestine (nontraumatic): Secondary | ICD-10-CM | POA: Diagnosis not present

## 2017-11-13 ENCOUNTER — Other Ambulatory Visit: Payer: Self-pay | Admitting: Sports Medicine

## 2017-11-16 DIAGNOSIS — K631 Perforation of intestine (nontraumatic): Secondary | ICD-10-CM | POA: Diagnosis not present

## 2017-11-16 DIAGNOSIS — Z5111 Encounter for antineoplastic chemotherapy: Secondary | ICD-10-CM | POA: Diagnosis not present

## 2017-11-16 DIAGNOSIS — C184 Malignant neoplasm of transverse colon: Secondary | ICD-10-CM | POA: Diagnosis not present

## 2017-11-16 DIAGNOSIS — T451X5A Adverse effect of antineoplastic and immunosuppressive drugs, initial encounter: Secondary | ICD-10-CM | POA: Diagnosis not present

## 2017-11-16 DIAGNOSIS — R110 Nausea: Secondary | ICD-10-CM | POA: Diagnosis not present

## 2017-11-16 DIAGNOSIS — D500 Iron deficiency anemia secondary to blood loss (chronic): Secondary | ICD-10-CM | POA: Diagnosis not present

## 2017-11-23 DIAGNOSIS — C184 Malignant neoplasm of transverse colon: Secondary | ICD-10-CM | POA: Diagnosis not present

## 2017-11-23 DIAGNOSIS — D509 Iron deficiency anemia, unspecified: Secondary | ICD-10-CM | POA: Diagnosis not present

## 2017-11-23 DIAGNOSIS — D701 Agranulocytosis secondary to cancer chemotherapy: Secondary | ICD-10-CM | POA: Diagnosis not present

## 2017-11-23 DIAGNOSIS — C182 Malignant neoplasm of ascending colon: Secondary | ICD-10-CM | POA: Diagnosis not present

## 2017-11-23 DIAGNOSIS — T451X5A Adverse effect of antineoplastic and immunosuppressive drugs, initial encounter: Secondary | ICD-10-CM | POA: Diagnosis not present

## 2017-11-23 DIAGNOSIS — C772 Secondary and unspecified malignant neoplasm of intra-abdominal lymph nodes: Secondary | ICD-10-CM | POA: Diagnosis not present

## 2017-11-23 DIAGNOSIS — R110 Nausea: Secondary | ICD-10-CM | POA: Diagnosis not present

## 2017-11-23 DIAGNOSIS — Z5111 Encounter for antineoplastic chemotherapy: Secondary | ICD-10-CM | POA: Diagnosis not present

## 2017-11-23 DIAGNOSIS — K219 Gastro-esophageal reflux disease without esophagitis: Secondary | ICD-10-CM | POA: Diagnosis not present

## 2017-11-23 DIAGNOSIS — Z006 Encounter for examination for normal comparison and control in clinical research program: Secondary | ICD-10-CM | POA: Diagnosis not present

## 2017-11-24 DIAGNOSIS — C182 Malignant neoplasm of ascending colon: Secondary | ICD-10-CM | POA: Diagnosis not present

## 2017-11-25 DIAGNOSIS — C184 Malignant neoplasm of transverse colon: Secondary | ICD-10-CM | POA: Diagnosis not present

## 2017-11-25 DIAGNOSIS — D701 Agranulocytosis secondary to cancer chemotherapy: Secondary | ICD-10-CM | POA: Diagnosis not present

## 2017-11-25 DIAGNOSIS — Z5111 Encounter for antineoplastic chemotherapy: Secondary | ICD-10-CM | POA: Diagnosis not present

## 2017-11-25 DIAGNOSIS — T451X5A Adverse effect of antineoplastic and immunosuppressive drugs, initial encounter: Secondary | ICD-10-CM | POA: Diagnosis not present

## 2017-11-25 DIAGNOSIS — R110 Nausea: Secondary | ICD-10-CM | POA: Diagnosis not present

## 2017-11-25 DIAGNOSIS — Z006 Encounter for examination for normal comparison and control in clinical research program: Secondary | ICD-10-CM | POA: Diagnosis not present

## 2017-11-25 DIAGNOSIS — D509 Iron deficiency anemia, unspecified: Secondary | ICD-10-CM | POA: Diagnosis not present

## 2017-11-25 DIAGNOSIS — K219 Gastro-esophageal reflux disease without esophagitis: Secondary | ICD-10-CM | POA: Diagnosis not present

## 2017-11-30 DIAGNOSIS — T451X5A Adverse effect of antineoplastic and immunosuppressive drugs, initial encounter: Secondary | ICD-10-CM | POA: Diagnosis not present

## 2017-11-30 DIAGNOSIS — D509 Iron deficiency anemia, unspecified: Secondary | ICD-10-CM | POA: Diagnosis not present

## 2017-11-30 DIAGNOSIS — Z006 Encounter for examination for normal comparison and control in clinical research program: Secondary | ICD-10-CM | POA: Diagnosis not present

## 2017-11-30 DIAGNOSIS — R110 Nausea: Secondary | ICD-10-CM | POA: Diagnosis not present

## 2017-11-30 DIAGNOSIS — C184 Malignant neoplasm of transverse colon: Secondary | ICD-10-CM | POA: Diagnosis not present

## 2017-11-30 DIAGNOSIS — Z5111 Encounter for antineoplastic chemotherapy: Secondary | ICD-10-CM | POA: Diagnosis not present

## 2017-11-30 DIAGNOSIS — D701 Agranulocytosis secondary to cancer chemotherapy: Secondary | ICD-10-CM | POA: Diagnosis not present

## 2017-11-30 DIAGNOSIS — K219 Gastro-esophageal reflux disease without esophagitis: Secondary | ICD-10-CM | POA: Diagnosis not present

## 2017-12-06 DIAGNOSIS — R110 Nausea: Secondary | ICD-10-CM | POA: Diagnosis not present

## 2017-12-06 DIAGNOSIS — Z006 Encounter for examination for normal comparison and control in clinical research program: Secondary | ICD-10-CM | POA: Diagnosis not present

## 2017-12-06 DIAGNOSIS — D701 Agranulocytosis secondary to cancer chemotherapy: Secondary | ICD-10-CM | POA: Diagnosis not present

## 2017-12-06 DIAGNOSIS — K219 Gastro-esophageal reflux disease without esophagitis: Secondary | ICD-10-CM | POA: Diagnosis not present

## 2017-12-06 DIAGNOSIS — T451X5A Adverse effect of antineoplastic and immunosuppressive drugs, initial encounter: Secondary | ICD-10-CM | POA: Diagnosis not present

## 2017-12-06 DIAGNOSIS — Z5111 Encounter for antineoplastic chemotherapy: Secondary | ICD-10-CM | POA: Diagnosis not present

## 2017-12-06 DIAGNOSIS — C182 Malignant neoplasm of ascending colon: Secondary | ICD-10-CM | POA: Diagnosis not present

## 2017-12-06 DIAGNOSIS — D509 Iron deficiency anemia, unspecified: Secondary | ICD-10-CM | POA: Diagnosis not present

## 2017-12-06 DIAGNOSIS — C184 Malignant neoplasm of transverse colon: Secondary | ICD-10-CM | POA: Diagnosis not present

## 2017-12-08 DIAGNOSIS — D509 Iron deficiency anemia, unspecified: Secondary | ICD-10-CM | POA: Diagnosis not present

## 2017-12-08 DIAGNOSIS — Z5111 Encounter for antineoplastic chemotherapy: Secondary | ICD-10-CM | POA: Diagnosis not present

## 2017-12-08 DIAGNOSIS — K219 Gastro-esophageal reflux disease without esophagitis: Secondary | ICD-10-CM | POA: Diagnosis not present

## 2017-12-08 DIAGNOSIS — D701 Agranulocytosis secondary to cancer chemotherapy: Secondary | ICD-10-CM | POA: Diagnosis not present

## 2017-12-08 DIAGNOSIS — R110 Nausea: Secondary | ICD-10-CM | POA: Diagnosis not present

## 2017-12-08 DIAGNOSIS — T451X5A Adverse effect of antineoplastic and immunosuppressive drugs, initial encounter: Secondary | ICD-10-CM | POA: Diagnosis not present

## 2017-12-08 DIAGNOSIS — C184 Malignant neoplasm of transverse colon: Secondary | ICD-10-CM | POA: Diagnosis not present

## 2017-12-08 DIAGNOSIS — Z006 Encounter for examination for normal comparison and control in clinical research program: Secondary | ICD-10-CM | POA: Diagnosis not present

## 2017-12-14 ENCOUNTER — Other Ambulatory Visit: Payer: Self-pay | Admitting: Sports Medicine

## 2017-12-14 DIAGNOSIS — C184 Malignant neoplasm of transverse colon: Secondary | ICD-10-CM | POA: Diagnosis not present

## 2017-12-14 DIAGNOSIS — K219 Gastro-esophageal reflux disease without esophagitis: Secondary | ICD-10-CM | POA: Diagnosis not present

## 2017-12-14 DIAGNOSIS — Z5111 Encounter for antineoplastic chemotherapy: Secondary | ICD-10-CM | POA: Diagnosis not present

## 2017-12-14 DIAGNOSIS — R110 Nausea: Secondary | ICD-10-CM | POA: Diagnosis not present

## 2017-12-14 DIAGNOSIS — T451X5A Adverse effect of antineoplastic and immunosuppressive drugs, initial encounter: Secondary | ICD-10-CM | POA: Diagnosis not present

## 2017-12-14 DIAGNOSIS — D701 Agranulocytosis secondary to cancer chemotherapy: Secondary | ICD-10-CM | POA: Diagnosis not present

## 2017-12-14 DIAGNOSIS — Z006 Encounter for examination for normal comparison and control in clinical research program: Secondary | ICD-10-CM | POA: Diagnosis not present

## 2017-12-14 DIAGNOSIS — D509 Iron deficiency anemia, unspecified: Secondary | ICD-10-CM | POA: Diagnosis not present

## 2017-12-21 DIAGNOSIS — R6889 Other general symptoms and signs: Secondary | ICD-10-CM | POA: Diagnosis not present

## 2017-12-21 DIAGNOSIS — C182 Malignant neoplasm of ascending colon: Secondary | ICD-10-CM | POA: Diagnosis not present

## 2017-12-21 DIAGNOSIS — T8189XD Other complications of procedures, not elsewhere classified, subsequent encounter: Secondary | ICD-10-CM | POA: Diagnosis not present

## 2017-12-21 DIAGNOSIS — T451X5A Adverse effect of antineoplastic and immunosuppressive drugs, initial encounter: Secondary | ICD-10-CM | POA: Diagnosis not present

## 2017-12-21 DIAGNOSIS — Z006 Encounter for examination for normal comparison and control in clinical research program: Secondary | ICD-10-CM | POA: Diagnosis not present

## 2017-12-21 DIAGNOSIS — T8189XA Other complications of procedures, not elsewhere classified, initial encounter: Secondary | ICD-10-CM | POA: Diagnosis not present

## 2017-12-21 DIAGNOSIS — R911 Solitary pulmonary nodule: Secondary | ICD-10-CM | POA: Diagnosis not present

## 2017-12-21 DIAGNOSIS — C184 Malignant neoplasm of transverse colon: Secondary | ICD-10-CM | POA: Diagnosis not present

## 2017-12-21 DIAGNOSIS — D701 Agranulocytosis secondary to cancer chemotherapy: Secondary | ICD-10-CM | POA: Diagnosis not present

## 2017-12-21 DIAGNOSIS — G620 Drug-induced polyneuropathy: Secondary | ICD-10-CM | POA: Diagnosis not present

## 2017-12-21 DIAGNOSIS — K631 Perforation of intestine (nontraumatic): Secondary | ICD-10-CM | POA: Diagnosis not present

## 2017-12-21 DIAGNOSIS — D500 Iron deficiency anemia secondary to blood loss (chronic): Secondary | ICD-10-CM | POA: Diagnosis not present

## 2017-12-21 DIAGNOSIS — Z5111 Encounter for antineoplastic chemotherapy: Secondary | ICD-10-CM | POA: Diagnosis not present

## 2017-12-23 DIAGNOSIS — C184 Malignant neoplasm of transverse colon: Secondary | ICD-10-CM | POA: Diagnosis not present

## 2017-12-23 DIAGNOSIS — Z006 Encounter for examination for normal comparison and control in clinical research program: Secondary | ICD-10-CM | POA: Diagnosis not present

## 2017-12-23 DIAGNOSIS — K631 Perforation of intestine (nontraumatic): Secondary | ICD-10-CM | POA: Diagnosis not present

## 2017-12-23 DIAGNOSIS — G620 Drug-induced polyneuropathy: Secondary | ICD-10-CM | POA: Diagnosis not present

## 2017-12-23 DIAGNOSIS — T8189XA Other complications of procedures, not elsewhere classified, initial encounter: Secondary | ICD-10-CM | POA: Diagnosis not present

## 2017-12-23 DIAGNOSIS — D701 Agranulocytosis secondary to cancer chemotherapy: Secondary | ICD-10-CM | POA: Diagnosis not present

## 2017-12-23 DIAGNOSIS — R911 Solitary pulmonary nodule: Secondary | ICD-10-CM | POA: Diagnosis not present

## 2017-12-23 DIAGNOSIS — T8189XD Other complications of procedures, not elsewhere classified, subsequent encounter: Secondary | ICD-10-CM | POA: Diagnosis not present

## 2017-12-23 DIAGNOSIS — D500 Iron deficiency anemia secondary to blood loss (chronic): Secondary | ICD-10-CM | POA: Diagnosis not present

## 2017-12-23 DIAGNOSIS — Z5111 Encounter for antineoplastic chemotherapy: Secondary | ICD-10-CM | POA: Diagnosis not present

## 2017-12-23 DIAGNOSIS — T451X5A Adverse effect of antineoplastic and immunosuppressive drugs, initial encounter: Secondary | ICD-10-CM | POA: Diagnosis not present

## 2017-12-23 DIAGNOSIS — R6889 Other general symptoms and signs: Secondary | ICD-10-CM | POA: Diagnosis not present

## 2017-12-24 DIAGNOSIS — C182 Malignant neoplasm of ascending colon: Secondary | ICD-10-CM | POA: Diagnosis not present

## 2017-12-28 DIAGNOSIS — C184 Malignant neoplasm of transverse colon: Secondary | ICD-10-CM | POA: Diagnosis not present

## 2017-12-28 DIAGNOSIS — T8189XD Other complications of procedures, not elsewhere classified, subsequent encounter: Secondary | ICD-10-CM | POA: Diagnosis not present

## 2017-12-28 DIAGNOSIS — R911 Solitary pulmonary nodule: Secondary | ICD-10-CM | POA: Diagnosis not present

## 2017-12-28 DIAGNOSIS — T8189XA Other complications of procedures, not elsewhere classified, initial encounter: Secondary | ICD-10-CM | POA: Diagnosis not present

## 2017-12-28 DIAGNOSIS — T451X5A Adverse effect of antineoplastic and immunosuppressive drugs, initial encounter: Secondary | ICD-10-CM | POA: Diagnosis not present

## 2017-12-28 DIAGNOSIS — Z5111 Encounter for antineoplastic chemotherapy: Secondary | ICD-10-CM | POA: Diagnosis not present

## 2017-12-28 DIAGNOSIS — G620 Drug-induced polyneuropathy: Secondary | ICD-10-CM | POA: Diagnosis not present

## 2017-12-28 DIAGNOSIS — Z006 Encounter for examination for normal comparison and control in clinical research program: Secondary | ICD-10-CM | POA: Diagnosis not present

## 2017-12-28 DIAGNOSIS — D701 Agranulocytosis secondary to cancer chemotherapy: Secondary | ICD-10-CM | POA: Diagnosis not present

## 2017-12-28 DIAGNOSIS — K631 Perforation of intestine (nontraumatic): Secondary | ICD-10-CM | POA: Diagnosis not present

## 2017-12-28 DIAGNOSIS — D500 Iron deficiency anemia secondary to blood loss (chronic): Secondary | ICD-10-CM | POA: Diagnosis not present

## 2017-12-28 DIAGNOSIS — R6889 Other general symptoms and signs: Secondary | ICD-10-CM | POA: Diagnosis not present

## 2018-01-04 DIAGNOSIS — T451X5A Adverse effect of antineoplastic and immunosuppressive drugs, initial encounter: Secondary | ICD-10-CM | POA: Diagnosis not present

## 2018-01-04 DIAGNOSIS — K631 Perforation of intestine (nontraumatic): Secondary | ICD-10-CM | POA: Diagnosis not present

## 2018-01-04 DIAGNOSIS — R911 Solitary pulmonary nodule: Secondary | ICD-10-CM | POA: Diagnosis not present

## 2018-01-04 DIAGNOSIS — D500 Iron deficiency anemia secondary to blood loss (chronic): Secondary | ICD-10-CM | POA: Diagnosis not present

## 2018-01-04 DIAGNOSIS — C184 Malignant neoplasm of transverse colon: Secondary | ICD-10-CM | POA: Diagnosis not present

## 2018-01-04 DIAGNOSIS — T8189XD Other complications of procedures, not elsewhere classified, subsequent encounter: Secondary | ICD-10-CM | POA: Diagnosis not present

## 2018-01-04 DIAGNOSIS — G620 Drug-induced polyneuropathy: Secondary | ICD-10-CM | POA: Insufficient documentation

## 2018-01-04 DIAGNOSIS — D701 Agranulocytosis secondary to cancer chemotherapy: Secondary | ICD-10-CM | POA: Diagnosis not present

## 2018-01-04 DIAGNOSIS — Z5111 Encounter for antineoplastic chemotherapy: Secondary | ICD-10-CM | POA: Diagnosis not present

## 2018-01-04 DIAGNOSIS — Z006 Encounter for examination for normal comparison and control in clinical research program: Secondary | ICD-10-CM | POA: Diagnosis not present

## 2018-01-04 DIAGNOSIS — T8189XA Other complications of procedures, not elsewhere classified, initial encounter: Secondary | ICD-10-CM | POA: Diagnosis not present

## 2018-01-04 DIAGNOSIS — R6889 Other general symptoms and signs: Secondary | ICD-10-CM | POA: Diagnosis not present

## 2018-01-05 DIAGNOSIS — C184 Malignant neoplasm of transverse colon: Secondary | ICD-10-CM | POA: Diagnosis not present

## 2018-01-06 DIAGNOSIS — R911 Solitary pulmonary nodule: Secondary | ICD-10-CM | POA: Diagnosis not present

## 2018-01-06 DIAGNOSIS — G620 Drug-induced polyneuropathy: Secondary | ICD-10-CM | POA: Diagnosis not present

## 2018-01-06 DIAGNOSIS — C184 Malignant neoplasm of transverse colon: Secondary | ICD-10-CM | POA: Diagnosis not present

## 2018-01-06 DIAGNOSIS — T8189XD Other complications of procedures, not elsewhere classified, subsequent encounter: Secondary | ICD-10-CM | POA: Diagnosis not present

## 2018-01-06 DIAGNOSIS — Z5111 Encounter for antineoplastic chemotherapy: Secondary | ICD-10-CM | POA: Diagnosis not present

## 2018-01-06 DIAGNOSIS — T8189XA Other complications of procedures, not elsewhere classified, initial encounter: Secondary | ICD-10-CM | POA: Diagnosis not present

## 2018-01-06 DIAGNOSIS — D701 Agranulocytosis secondary to cancer chemotherapy: Secondary | ICD-10-CM | POA: Diagnosis not present

## 2018-01-06 DIAGNOSIS — K631 Perforation of intestine (nontraumatic): Secondary | ICD-10-CM | POA: Diagnosis not present

## 2018-01-06 DIAGNOSIS — T451X5A Adverse effect of antineoplastic and immunosuppressive drugs, initial encounter: Secondary | ICD-10-CM | POA: Diagnosis not present

## 2018-01-06 DIAGNOSIS — Z006 Encounter for examination for normal comparison and control in clinical research program: Secondary | ICD-10-CM | POA: Diagnosis not present

## 2018-01-06 DIAGNOSIS — R6889 Other general symptoms and signs: Secondary | ICD-10-CM | POA: Diagnosis not present

## 2018-01-06 DIAGNOSIS — D500 Iron deficiency anemia secondary to blood loss (chronic): Secondary | ICD-10-CM | POA: Diagnosis not present

## 2018-01-11 DIAGNOSIS — T8189XA Other complications of procedures, not elsewhere classified, initial encounter: Secondary | ICD-10-CM | POA: Diagnosis not present

## 2018-01-11 DIAGNOSIS — Z5111 Encounter for antineoplastic chemotherapy: Secondary | ICD-10-CM | POA: Diagnosis not present

## 2018-01-11 DIAGNOSIS — K631 Perforation of intestine (nontraumatic): Secondary | ICD-10-CM | POA: Diagnosis not present

## 2018-01-11 DIAGNOSIS — R911 Solitary pulmonary nodule: Secondary | ICD-10-CM | POA: Diagnosis not present

## 2018-01-11 DIAGNOSIS — T451X5A Adverse effect of antineoplastic and immunosuppressive drugs, initial encounter: Secondary | ICD-10-CM | POA: Diagnosis not present

## 2018-01-11 DIAGNOSIS — Z006 Encounter for examination for normal comparison and control in clinical research program: Secondary | ICD-10-CM | POA: Diagnosis not present

## 2018-01-11 DIAGNOSIS — T8189XD Other complications of procedures, not elsewhere classified, subsequent encounter: Secondary | ICD-10-CM | POA: Diagnosis not present

## 2018-01-11 DIAGNOSIS — D701 Agranulocytosis secondary to cancer chemotherapy: Secondary | ICD-10-CM | POA: Diagnosis not present

## 2018-01-11 DIAGNOSIS — G620 Drug-induced polyneuropathy: Secondary | ICD-10-CM | POA: Diagnosis not present

## 2018-01-11 DIAGNOSIS — D500 Iron deficiency anemia secondary to blood loss (chronic): Secondary | ICD-10-CM | POA: Diagnosis not present

## 2018-01-11 DIAGNOSIS — R6889 Other general symptoms and signs: Secondary | ICD-10-CM | POA: Diagnosis not present

## 2018-01-11 DIAGNOSIS — C184 Malignant neoplasm of transverse colon: Secondary | ICD-10-CM | POA: Diagnosis not present

## 2018-01-13 DIAGNOSIS — C184 Malignant neoplasm of transverse colon: Secondary | ICD-10-CM | POA: Diagnosis not present

## 2018-01-18 DIAGNOSIS — T8189XD Other complications of procedures, not elsewhere classified, subsequent encounter: Secondary | ICD-10-CM | POA: Diagnosis not present

## 2018-01-18 DIAGNOSIS — G620 Drug-induced polyneuropathy: Secondary | ICD-10-CM | POA: Diagnosis not present

## 2018-01-18 DIAGNOSIS — T8189XA Other complications of procedures, not elsewhere classified, initial encounter: Secondary | ICD-10-CM | POA: Diagnosis not present

## 2018-01-18 DIAGNOSIS — C182 Malignant neoplasm of ascending colon: Secondary | ICD-10-CM | POA: Diagnosis not present

## 2018-01-18 DIAGNOSIS — D500 Iron deficiency anemia secondary to blood loss (chronic): Secondary | ICD-10-CM | POA: Diagnosis not present

## 2018-01-18 DIAGNOSIS — T451X5A Adverse effect of antineoplastic and immunosuppressive drugs, initial encounter: Secondary | ICD-10-CM | POA: Diagnosis not present

## 2018-01-18 DIAGNOSIS — R6889 Other general symptoms and signs: Secondary | ICD-10-CM | POA: Diagnosis not present

## 2018-01-18 DIAGNOSIS — D701 Agranulocytosis secondary to cancer chemotherapy: Secondary | ICD-10-CM | POA: Diagnosis not present

## 2018-01-18 DIAGNOSIS — Z006 Encounter for examination for normal comparison and control in clinical research program: Secondary | ICD-10-CM | POA: Diagnosis not present

## 2018-01-18 DIAGNOSIS — C184 Malignant neoplasm of transverse colon: Secondary | ICD-10-CM | POA: Diagnosis not present

## 2018-01-18 DIAGNOSIS — R911 Solitary pulmonary nodule: Secondary | ICD-10-CM | POA: Diagnosis not present

## 2018-01-18 DIAGNOSIS — Z5111 Encounter for antineoplastic chemotherapy: Secondary | ICD-10-CM | POA: Diagnosis not present

## 2018-01-18 DIAGNOSIS — K631 Perforation of intestine (nontraumatic): Secondary | ICD-10-CM | POA: Diagnosis not present

## 2018-01-20 DIAGNOSIS — C184 Malignant neoplasm of transverse colon: Secondary | ICD-10-CM | POA: Diagnosis not present

## 2018-01-20 DIAGNOSIS — Z5111 Encounter for antineoplastic chemotherapy: Secondary | ICD-10-CM | POA: Diagnosis not present

## 2018-01-20 DIAGNOSIS — T8189XD Other complications of procedures, not elsewhere classified, subsequent encounter: Secondary | ICD-10-CM | POA: Diagnosis not present

## 2018-01-20 DIAGNOSIS — K631 Perforation of intestine (nontraumatic): Secondary | ICD-10-CM | POA: Diagnosis not present

## 2018-01-20 DIAGNOSIS — R911 Solitary pulmonary nodule: Secondary | ICD-10-CM | POA: Diagnosis not present

## 2018-01-20 DIAGNOSIS — D701 Agranulocytosis secondary to cancer chemotherapy: Secondary | ICD-10-CM | POA: Diagnosis not present

## 2018-01-20 DIAGNOSIS — Z006 Encounter for examination for normal comparison and control in clinical research program: Secondary | ICD-10-CM | POA: Diagnosis not present

## 2018-01-20 DIAGNOSIS — T451X5A Adverse effect of antineoplastic and immunosuppressive drugs, initial encounter: Secondary | ICD-10-CM | POA: Diagnosis not present

## 2018-01-20 DIAGNOSIS — T8189XA Other complications of procedures, not elsewhere classified, initial encounter: Secondary | ICD-10-CM | POA: Diagnosis not present

## 2018-01-20 DIAGNOSIS — G620 Drug-induced polyneuropathy: Secondary | ICD-10-CM | POA: Diagnosis not present

## 2018-01-20 DIAGNOSIS — D500 Iron deficiency anemia secondary to blood loss (chronic): Secondary | ICD-10-CM | POA: Diagnosis not present

## 2018-01-20 DIAGNOSIS — R6889 Other general symptoms and signs: Secondary | ICD-10-CM | POA: Diagnosis not present

## 2018-01-25 DIAGNOSIS — G620 Drug-induced polyneuropathy: Secondary | ICD-10-CM | POA: Diagnosis not present

## 2018-01-25 DIAGNOSIS — C184 Malignant neoplasm of transverse colon: Secondary | ICD-10-CM | POA: Diagnosis not present

## 2018-01-25 DIAGNOSIS — D701 Agranulocytosis secondary to cancer chemotherapy: Secondary | ICD-10-CM | POA: Diagnosis not present

## 2018-01-25 DIAGNOSIS — Z006 Encounter for examination for normal comparison and control in clinical research program: Secondary | ICD-10-CM | POA: Diagnosis not present

## 2018-01-25 DIAGNOSIS — T451X5A Adverse effect of antineoplastic and immunosuppressive drugs, initial encounter: Secondary | ICD-10-CM | POA: Diagnosis not present

## 2018-01-25 DIAGNOSIS — R911 Solitary pulmonary nodule: Secondary | ICD-10-CM | POA: Diagnosis not present

## 2018-01-25 DIAGNOSIS — Z5111 Encounter for antineoplastic chemotherapy: Secondary | ICD-10-CM | POA: Diagnosis not present

## 2018-01-25 DIAGNOSIS — D500 Iron deficiency anemia secondary to blood loss (chronic): Secondary | ICD-10-CM | POA: Diagnosis not present

## 2018-01-25 DIAGNOSIS — R6889 Other general symptoms and signs: Secondary | ICD-10-CM | POA: Diagnosis not present

## 2018-01-25 DIAGNOSIS — T8189XA Other complications of procedures, not elsewhere classified, initial encounter: Secondary | ICD-10-CM | POA: Diagnosis not present

## 2018-02-01 DIAGNOSIS — Z5111 Encounter for antineoplastic chemotherapy: Secondary | ICD-10-CM | POA: Diagnosis not present

## 2018-02-01 DIAGNOSIS — D500 Iron deficiency anemia secondary to blood loss (chronic): Secondary | ICD-10-CM | POA: Diagnosis not present

## 2018-02-01 DIAGNOSIS — G620 Drug-induced polyneuropathy: Secondary | ICD-10-CM | POA: Diagnosis not present

## 2018-02-01 DIAGNOSIS — Z006 Encounter for examination for normal comparison and control in clinical research program: Secondary | ICD-10-CM | POA: Diagnosis not present

## 2018-02-01 DIAGNOSIS — T8189XA Other complications of procedures, not elsewhere classified, initial encounter: Secondary | ICD-10-CM | POA: Diagnosis not present

## 2018-02-01 DIAGNOSIS — R911 Solitary pulmonary nodule: Secondary | ICD-10-CM | POA: Diagnosis not present

## 2018-02-01 DIAGNOSIS — C184 Malignant neoplasm of transverse colon: Secondary | ICD-10-CM | POA: Diagnosis not present

## 2018-02-01 DIAGNOSIS — R6889 Other general symptoms and signs: Secondary | ICD-10-CM | POA: Diagnosis not present

## 2018-02-01 DIAGNOSIS — T451X5A Adverse effect of antineoplastic and immunosuppressive drugs, initial encounter: Secondary | ICD-10-CM | POA: Diagnosis not present

## 2018-02-01 DIAGNOSIS — D701 Agranulocytosis secondary to cancer chemotherapy: Secondary | ICD-10-CM | POA: Diagnosis not present

## 2018-02-01 DIAGNOSIS — C772 Secondary and unspecified malignant neoplasm of intra-abdominal lymph nodes: Secondary | ICD-10-CM | POA: Diagnosis not present

## 2018-02-01 DIAGNOSIS — C7989 Secondary malignant neoplasm of other specified sites: Secondary | ICD-10-CM | POA: Diagnosis not present

## 2018-02-03 DIAGNOSIS — T451X5A Adverse effect of antineoplastic and immunosuppressive drugs, initial encounter: Secondary | ICD-10-CM | POA: Diagnosis not present

## 2018-02-03 DIAGNOSIS — C184 Malignant neoplasm of transverse colon: Secondary | ICD-10-CM | POA: Diagnosis not present

## 2018-02-03 DIAGNOSIS — R6889 Other general symptoms and signs: Secondary | ICD-10-CM | POA: Diagnosis not present

## 2018-02-03 DIAGNOSIS — D701 Agranulocytosis secondary to cancer chemotherapy: Secondary | ICD-10-CM | POA: Diagnosis not present

## 2018-02-03 DIAGNOSIS — Z5111 Encounter for antineoplastic chemotherapy: Secondary | ICD-10-CM | POA: Diagnosis not present

## 2018-02-03 DIAGNOSIS — D500 Iron deficiency anemia secondary to blood loss (chronic): Secondary | ICD-10-CM | POA: Diagnosis not present

## 2018-02-03 DIAGNOSIS — R911 Solitary pulmonary nodule: Secondary | ICD-10-CM | POA: Diagnosis not present

## 2018-02-03 DIAGNOSIS — T8189XA Other complications of procedures, not elsewhere classified, initial encounter: Secondary | ICD-10-CM | POA: Diagnosis not present

## 2018-02-03 DIAGNOSIS — Z006 Encounter for examination for normal comparison and control in clinical research program: Secondary | ICD-10-CM | POA: Diagnosis not present

## 2018-02-03 DIAGNOSIS — G620 Drug-induced polyneuropathy: Secondary | ICD-10-CM | POA: Diagnosis not present

## 2018-02-10 DIAGNOSIS — D701 Agranulocytosis secondary to cancer chemotherapy: Secondary | ICD-10-CM | POA: Diagnosis not present

## 2018-02-10 DIAGNOSIS — D500 Iron deficiency anemia secondary to blood loss (chronic): Secondary | ICD-10-CM | POA: Diagnosis not present

## 2018-02-10 DIAGNOSIS — R6889 Other general symptoms and signs: Secondary | ICD-10-CM | POA: Diagnosis not present

## 2018-02-10 DIAGNOSIS — Z006 Encounter for examination for normal comparison and control in clinical research program: Secondary | ICD-10-CM | POA: Diagnosis not present

## 2018-02-10 DIAGNOSIS — R911 Solitary pulmonary nodule: Secondary | ICD-10-CM | POA: Diagnosis not present

## 2018-02-10 DIAGNOSIS — G620 Drug-induced polyneuropathy: Secondary | ICD-10-CM | POA: Diagnosis not present

## 2018-02-10 DIAGNOSIS — T8189XA Other complications of procedures, not elsewhere classified, initial encounter: Secondary | ICD-10-CM | POA: Diagnosis not present

## 2018-02-10 DIAGNOSIS — C184 Malignant neoplasm of transverse colon: Secondary | ICD-10-CM | POA: Diagnosis not present

## 2018-02-10 DIAGNOSIS — Z5111 Encounter for antineoplastic chemotherapy: Secondary | ICD-10-CM | POA: Diagnosis not present

## 2018-02-10 DIAGNOSIS — T451X5A Adverse effect of antineoplastic and immunosuppressive drugs, initial encounter: Secondary | ICD-10-CM | POA: Diagnosis not present

## 2018-02-15 DIAGNOSIS — Z006 Encounter for examination for normal comparison and control in clinical research program: Secondary | ICD-10-CM | POA: Diagnosis not present

## 2018-02-15 DIAGNOSIS — K631 Perforation of intestine (nontraumatic): Secondary | ICD-10-CM | POA: Diagnosis not present

## 2018-02-15 DIAGNOSIS — Z5111 Encounter for antineoplastic chemotherapy: Secondary | ICD-10-CM | POA: Diagnosis not present

## 2018-02-15 DIAGNOSIS — R6889 Other general symptoms and signs: Secondary | ICD-10-CM | POA: Diagnosis not present

## 2018-02-15 DIAGNOSIS — D701 Agranulocytosis secondary to cancer chemotherapy: Secondary | ICD-10-CM | POA: Diagnosis not present

## 2018-02-15 DIAGNOSIS — T8189XD Other complications of procedures, not elsewhere classified, subsequent encounter: Secondary | ICD-10-CM | POA: Diagnosis not present

## 2018-02-15 DIAGNOSIS — G620 Drug-induced polyneuropathy: Secondary | ICD-10-CM | POA: Diagnosis not present

## 2018-02-15 DIAGNOSIS — C184 Malignant neoplasm of transverse colon: Secondary | ICD-10-CM | POA: Diagnosis not present

## 2018-02-15 DIAGNOSIS — T451X5A Adverse effect of antineoplastic and immunosuppressive drugs, initial encounter: Secondary | ICD-10-CM | POA: Diagnosis not present

## 2018-02-15 DIAGNOSIS — C772 Secondary and unspecified malignant neoplasm of intra-abdominal lymph nodes: Secondary | ICD-10-CM | POA: Diagnosis not present

## 2018-02-15 DIAGNOSIS — C7989 Secondary malignant neoplasm of other specified sites: Secondary | ICD-10-CM | POA: Diagnosis not present

## 2018-02-15 DIAGNOSIS — R911 Solitary pulmonary nodule: Secondary | ICD-10-CM | POA: Diagnosis not present

## 2018-02-15 DIAGNOSIS — D500 Iron deficiency anemia secondary to blood loss (chronic): Secondary | ICD-10-CM | POA: Diagnosis not present

## 2018-02-17 DIAGNOSIS — D701 Agranulocytosis secondary to cancer chemotherapy: Secondary | ICD-10-CM | POA: Diagnosis not present

## 2018-02-17 DIAGNOSIS — T8189XD Other complications of procedures, not elsewhere classified, subsequent encounter: Secondary | ICD-10-CM | POA: Diagnosis not present

## 2018-02-17 DIAGNOSIS — Z006 Encounter for examination for normal comparison and control in clinical research program: Secondary | ICD-10-CM | POA: Diagnosis not present

## 2018-02-17 DIAGNOSIS — T451X5A Adverse effect of antineoplastic and immunosuppressive drugs, initial encounter: Secondary | ICD-10-CM | POA: Diagnosis not present

## 2018-02-17 DIAGNOSIS — K631 Perforation of intestine (nontraumatic): Secondary | ICD-10-CM | POA: Diagnosis not present

## 2018-02-17 DIAGNOSIS — Z5111 Encounter for antineoplastic chemotherapy: Secondary | ICD-10-CM | POA: Diagnosis not present

## 2018-02-17 DIAGNOSIS — R911 Solitary pulmonary nodule: Secondary | ICD-10-CM | POA: Diagnosis not present

## 2018-02-17 DIAGNOSIS — D500 Iron deficiency anemia secondary to blood loss (chronic): Secondary | ICD-10-CM | POA: Diagnosis not present

## 2018-02-17 DIAGNOSIS — R6889 Other general symptoms and signs: Secondary | ICD-10-CM | POA: Diagnosis not present

## 2018-02-17 DIAGNOSIS — C184 Malignant neoplasm of transverse colon: Secondary | ICD-10-CM | POA: Diagnosis not present

## 2018-02-22 DIAGNOSIS — Z006 Encounter for examination for normal comparison and control in clinical research program: Secondary | ICD-10-CM | POA: Diagnosis not present

## 2018-02-22 DIAGNOSIS — R6889 Other general symptoms and signs: Secondary | ICD-10-CM | POA: Diagnosis not present

## 2018-02-22 DIAGNOSIS — T451X5A Adverse effect of antineoplastic and immunosuppressive drugs, initial encounter: Secondary | ICD-10-CM | POA: Diagnosis not present

## 2018-02-22 DIAGNOSIS — K439 Ventral hernia without obstruction or gangrene: Secondary | ICD-10-CM | POA: Diagnosis not present

## 2018-02-22 DIAGNOSIS — D490 Neoplasm of unspecified behavior of digestive system: Secondary | ICD-10-CM | POA: Diagnosis not present

## 2018-02-22 DIAGNOSIS — J9811 Atelectasis: Secondary | ICD-10-CM | POA: Diagnosis not present

## 2018-02-22 DIAGNOSIS — R911 Solitary pulmonary nodule: Secondary | ICD-10-CM | POA: Diagnosis not present

## 2018-02-22 DIAGNOSIS — D500 Iron deficiency anemia secondary to blood loss (chronic): Secondary | ICD-10-CM | POA: Diagnosis not present

## 2018-02-22 DIAGNOSIS — K6389 Other specified diseases of intestine: Secondary | ICD-10-CM | POA: Diagnosis not present

## 2018-02-22 DIAGNOSIS — K7689 Other specified diseases of liver: Secondary | ICD-10-CM | POA: Diagnosis not present

## 2018-02-22 DIAGNOSIS — K631 Perforation of intestine (nontraumatic): Secondary | ICD-10-CM | POA: Diagnosis not present

## 2018-02-22 DIAGNOSIS — C184 Malignant neoplasm of transverse colon: Secondary | ICD-10-CM | POA: Diagnosis not present

## 2018-02-22 DIAGNOSIS — Z5111 Encounter for antineoplastic chemotherapy: Secondary | ICD-10-CM | POA: Diagnosis not present

## 2018-02-22 DIAGNOSIS — G620 Drug-induced polyneuropathy: Secondary | ICD-10-CM | POA: Diagnosis not present

## 2018-02-22 DIAGNOSIS — R918 Other nonspecific abnormal finding of lung field: Secondary | ICD-10-CM | POA: Diagnosis not present

## 2018-02-22 DIAGNOSIS — T8189XD Other complications of procedures, not elsewhere classified, subsequent encounter: Secondary | ICD-10-CM | POA: Diagnosis not present

## 2018-02-22 DIAGNOSIS — D701 Agranulocytosis secondary to cancer chemotherapy: Secondary | ICD-10-CM | POA: Diagnosis not present

## 2018-03-01 DIAGNOSIS — T451X5A Adverse effect of antineoplastic and immunosuppressive drugs, initial encounter: Secondary | ICD-10-CM | POA: Diagnosis not present

## 2018-03-01 DIAGNOSIS — R6889 Other general symptoms and signs: Secondary | ICD-10-CM | POA: Diagnosis not present

## 2018-03-01 DIAGNOSIS — C184 Malignant neoplasm of transverse colon: Secondary | ICD-10-CM | POA: Diagnosis not present

## 2018-03-01 DIAGNOSIS — R911 Solitary pulmonary nodule: Secondary | ICD-10-CM | POA: Diagnosis not present

## 2018-03-01 DIAGNOSIS — K631 Perforation of intestine (nontraumatic): Secondary | ICD-10-CM | POA: Diagnosis not present

## 2018-03-01 DIAGNOSIS — Z5111 Encounter for antineoplastic chemotherapy: Secondary | ICD-10-CM | POA: Diagnosis not present

## 2018-03-01 DIAGNOSIS — D701 Agranulocytosis secondary to cancer chemotherapy: Secondary | ICD-10-CM | POA: Diagnosis not present

## 2018-03-01 DIAGNOSIS — Z006 Encounter for examination for normal comparison and control in clinical research program: Secondary | ICD-10-CM | POA: Diagnosis not present

## 2018-03-01 DIAGNOSIS — K439 Ventral hernia without obstruction or gangrene: Secondary | ICD-10-CM | POA: Diagnosis not present

## 2018-03-01 DIAGNOSIS — D500 Iron deficiency anemia secondary to blood loss (chronic): Secondary | ICD-10-CM | POA: Diagnosis not present

## 2018-03-01 DIAGNOSIS — G620 Drug-induced polyneuropathy: Secondary | ICD-10-CM | POA: Diagnosis not present

## 2018-03-03 DIAGNOSIS — Z452 Encounter for adjustment and management of vascular access device: Secondary | ICD-10-CM | POA: Diagnosis not present

## 2018-03-03 DIAGNOSIS — C184 Malignant neoplasm of transverse colon: Secondary | ICD-10-CM | POA: Diagnosis not present

## 2018-03-03 DIAGNOSIS — Z006 Encounter for examination for normal comparison and control in clinical research program: Secondary | ICD-10-CM | POA: Diagnosis not present

## 2018-03-10 DIAGNOSIS — C184 Malignant neoplasm of transverse colon: Secondary | ICD-10-CM | POA: Diagnosis not present

## 2018-03-10 DIAGNOSIS — K432 Incisional hernia without obstruction or gangrene: Secondary | ICD-10-CM | POA: Diagnosis not present

## 2018-03-17 DIAGNOSIS — K432 Incisional hernia without obstruction or gangrene: Secondary | ICD-10-CM | POA: Diagnosis not present

## 2018-03-17 DIAGNOSIS — C184 Malignant neoplasm of transverse colon: Secondary | ICD-10-CM | POA: Diagnosis not present

## 2018-03-20 DIAGNOSIS — K432 Incisional hernia without obstruction or gangrene: Secondary | ICD-10-CM | POA: Insufficient documentation

## 2018-04-04 DIAGNOSIS — Z85038 Personal history of other malignant neoplasm of large intestine: Secondary | ICD-10-CM | POA: Diagnosis not present

## 2018-04-04 DIAGNOSIS — K432 Incisional hernia without obstruction or gangrene: Secondary | ICD-10-CM | POA: Diagnosis not present

## 2018-04-04 DIAGNOSIS — Z6828 Body mass index (BMI) 28.0-28.9, adult: Secondary | ICD-10-CM | POA: Diagnosis not present

## 2018-04-04 DIAGNOSIS — Z79899 Other long term (current) drug therapy: Secondary | ICD-10-CM | POA: Diagnosis not present

## 2018-04-04 DIAGNOSIS — Z9049 Acquired absence of other specified parts of digestive tract: Secondary | ICD-10-CM | POA: Diagnosis not present

## 2018-04-04 DIAGNOSIS — G620 Drug-induced polyneuropathy: Secondary | ICD-10-CM | POA: Diagnosis not present

## 2018-04-04 DIAGNOSIS — D701 Agranulocytosis secondary to cancer chemotherapy: Secondary | ICD-10-CM | POA: Diagnosis not present

## 2018-04-04 DIAGNOSIS — G709 Myoneural disorder, unspecified: Secondary | ICD-10-CM | POA: Diagnosis not present

## 2018-04-04 DIAGNOSIS — J45909 Unspecified asthma, uncomplicated: Secondary | ICD-10-CM | POA: Diagnosis not present

## 2018-04-04 DIAGNOSIS — Z5331 Laparoscopic surgical procedure converted to open procedure: Secondary | ICD-10-CM | POA: Diagnosis not present

## 2018-04-04 DIAGNOSIS — E669 Obesity, unspecified: Secondary | ICD-10-CM | POA: Diagnosis not present

## 2018-04-05 DIAGNOSIS — Z9049 Acquired absence of other specified parts of digestive tract: Secondary | ICD-10-CM | POA: Diagnosis not present

## 2018-04-05 DIAGNOSIS — G709 Myoneural disorder, unspecified: Secondary | ICD-10-CM | POA: Diagnosis not present

## 2018-04-05 DIAGNOSIS — D701 Agranulocytosis secondary to cancer chemotherapy: Secondary | ICD-10-CM | POA: Diagnosis not present

## 2018-04-05 DIAGNOSIS — Z5331 Laparoscopic surgical procedure converted to open procedure: Secondary | ICD-10-CM | POA: Diagnosis not present

## 2018-04-05 DIAGNOSIS — Z85038 Personal history of other malignant neoplasm of large intestine: Secondary | ICD-10-CM | POA: Diagnosis not present

## 2018-04-05 DIAGNOSIS — Z6828 Body mass index (BMI) 28.0-28.9, adult: Secondary | ICD-10-CM | POA: Diagnosis not present

## 2018-04-05 DIAGNOSIS — G620 Drug-induced polyneuropathy: Secondary | ICD-10-CM | POA: Diagnosis not present

## 2018-04-05 DIAGNOSIS — K432 Incisional hernia without obstruction or gangrene: Secondary | ICD-10-CM | POA: Diagnosis not present

## 2018-04-05 DIAGNOSIS — E669 Obesity, unspecified: Secondary | ICD-10-CM | POA: Diagnosis not present

## 2018-04-05 DIAGNOSIS — Z79899 Other long term (current) drug therapy: Secondary | ICD-10-CM | POA: Diagnosis not present

## 2018-04-05 DIAGNOSIS — J45909 Unspecified asthma, uncomplicated: Secondary | ICD-10-CM | POA: Diagnosis not present

## 2018-04-06 DIAGNOSIS — Z9049 Acquired absence of other specified parts of digestive tract: Secondary | ICD-10-CM | POA: Diagnosis not present

## 2018-04-06 DIAGNOSIS — G709 Myoneural disorder, unspecified: Secondary | ICD-10-CM | POA: Diagnosis not present

## 2018-04-06 DIAGNOSIS — J45909 Unspecified asthma, uncomplicated: Secondary | ICD-10-CM | POA: Diagnosis not present

## 2018-04-06 DIAGNOSIS — Z5331 Laparoscopic surgical procedure converted to open procedure: Secondary | ICD-10-CM | POA: Diagnosis not present

## 2018-04-06 DIAGNOSIS — Z6828 Body mass index (BMI) 28.0-28.9, adult: Secondary | ICD-10-CM | POA: Diagnosis not present

## 2018-04-06 DIAGNOSIS — E669 Obesity, unspecified: Secondary | ICD-10-CM | POA: Diagnosis not present

## 2018-04-06 DIAGNOSIS — D701 Agranulocytosis secondary to cancer chemotherapy: Secondary | ICD-10-CM | POA: Diagnosis not present

## 2018-04-06 DIAGNOSIS — Z85038 Personal history of other malignant neoplasm of large intestine: Secondary | ICD-10-CM | POA: Diagnosis not present

## 2018-04-06 DIAGNOSIS — Z79899 Other long term (current) drug therapy: Secondary | ICD-10-CM | POA: Diagnosis not present

## 2018-04-06 DIAGNOSIS — G620 Drug-induced polyneuropathy: Secondary | ICD-10-CM | POA: Diagnosis not present

## 2018-04-06 DIAGNOSIS — K432 Incisional hernia without obstruction or gangrene: Secondary | ICD-10-CM | POA: Diagnosis not present

## 2018-04-17 DIAGNOSIS — C189 Malignant neoplasm of colon, unspecified: Secondary | ICD-10-CM | POA: Diagnosis not present

## 2018-04-17 DIAGNOSIS — Z5111 Encounter for antineoplastic chemotherapy: Secondary | ICD-10-CM | POA: Diagnosis not present

## 2018-04-17 DIAGNOSIS — Z452 Encounter for adjustment and management of vascular access device: Secondary | ICD-10-CM | POA: Diagnosis not present

## 2018-06-01 DIAGNOSIS — G620 Drug-induced polyneuropathy: Secondary | ICD-10-CM | POA: Diagnosis not present

## 2018-06-01 DIAGNOSIS — K432 Incisional hernia without obstruction or gangrene: Secondary | ICD-10-CM | POA: Diagnosis not present

## 2018-06-01 DIAGNOSIS — C184 Malignant neoplasm of transverse colon: Secondary | ICD-10-CM | POA: Diagnosis not present

## 2018-06-01 DIAGNOSIS — Z85038 Personal history of other malignant neoplasm of large intestine: Secondary | ICD-10-CM | POA: Diagnosis not present

## 2018-06-01 DIAGNOSIS — C772 Secondary and unspecified malignant neoplasm of intra-abdominal lymph nodes: Secondary | ICD-10-CM | POA: Diagnosis not present

## 2018-06-01 DIAGNOSIS — K631 Perforation of intestine (nontraumatic): Secondary | ICD-10-CM | POA: Diagnosis not present

## 2018-06-01 DIAGNOSIS — R911 Solitary pulmonary nodule: Secondary | ICD-10-CM | POA: Diagnosis not present

## 2018-06-01 DIAGNOSIS — T451X5A Adverse effect of antineoplastic and immunosuppressive drugs, initial encounter: Secondary | ICD-10-CM | POA: Diagnosis not present

## 2018-07-07 IMAGING — DX DG CERVICAL SPINE COMPLETE 4+V
5 series · 5 of 5 positions shown · non-contrast
Comparison: None.

CLINICAL DATA: Right cervical radiculopathy

EXAM:
CERVICAL SPINE - COMPLETE 4+ VIEW

[c-spine lat]
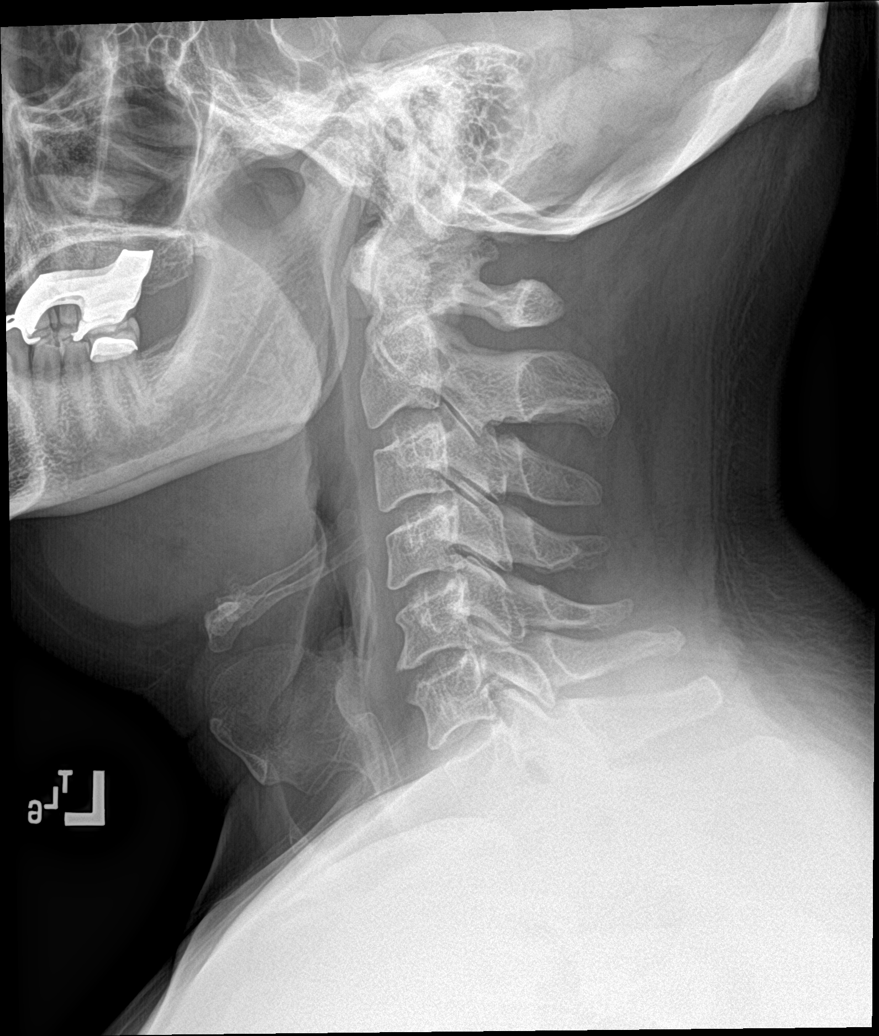

[c-spine obl (1 of 2)]
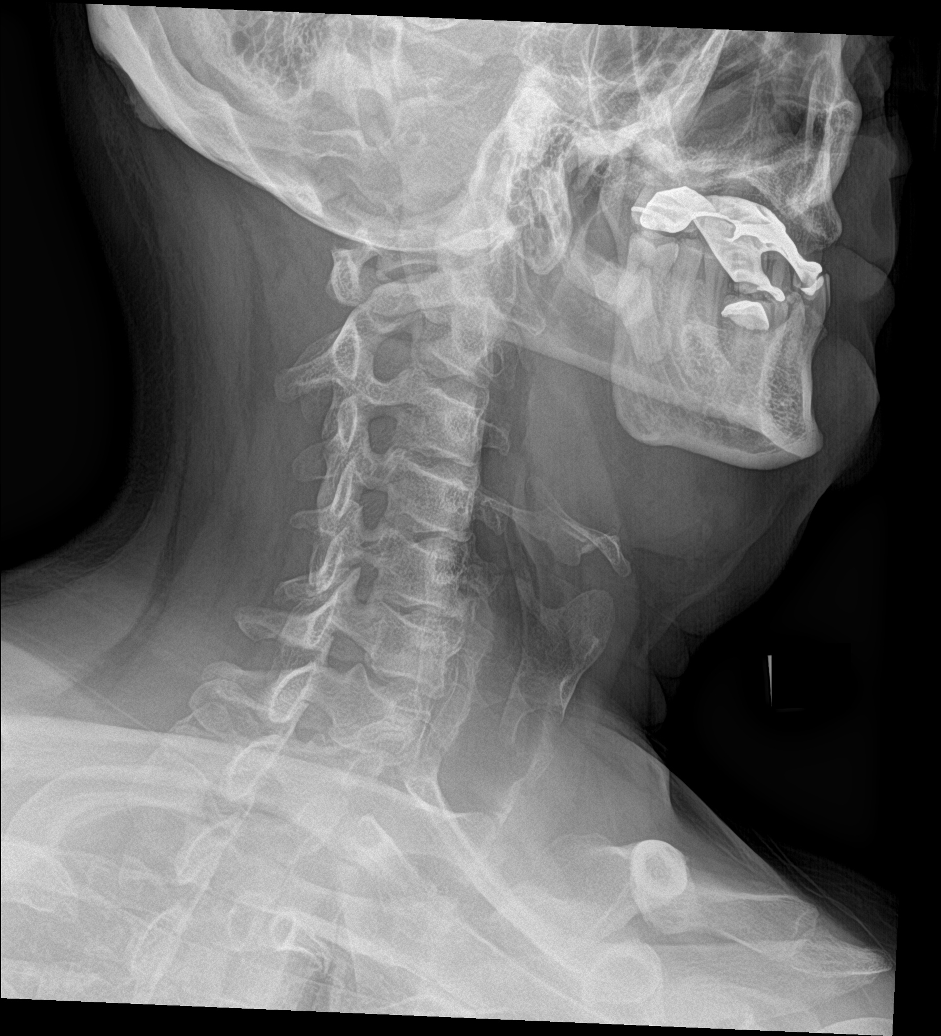

[c-spine obl (2 of 2)]
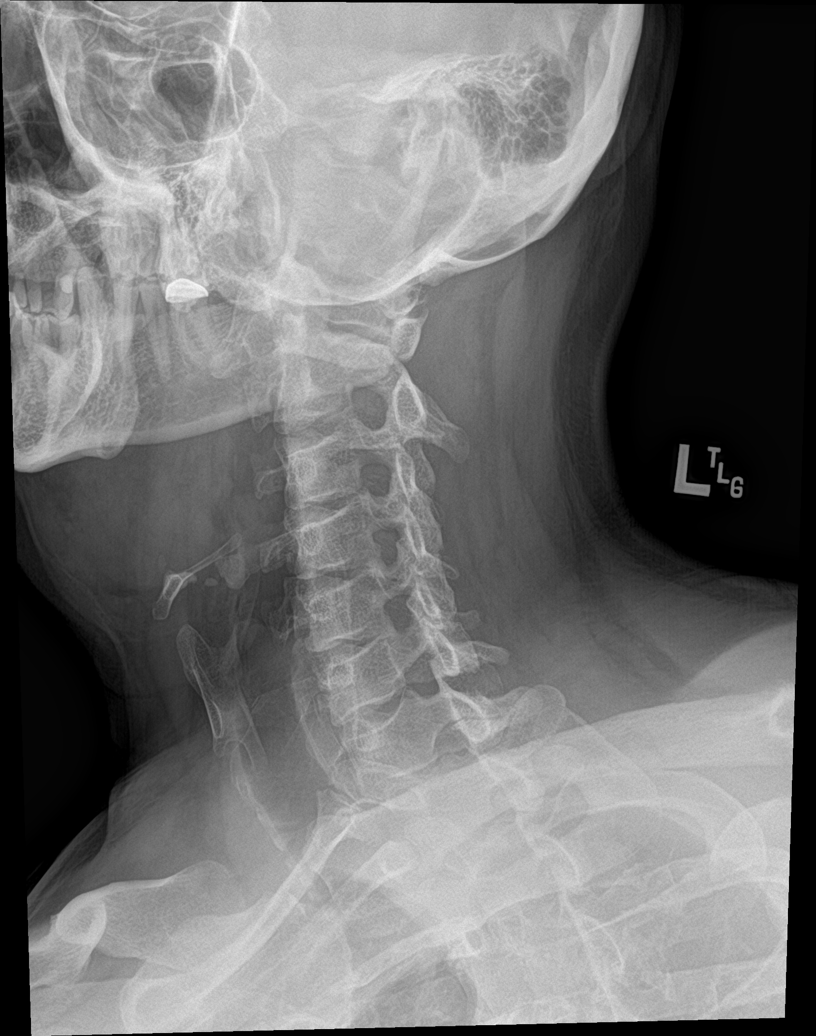

[c-spine ap]
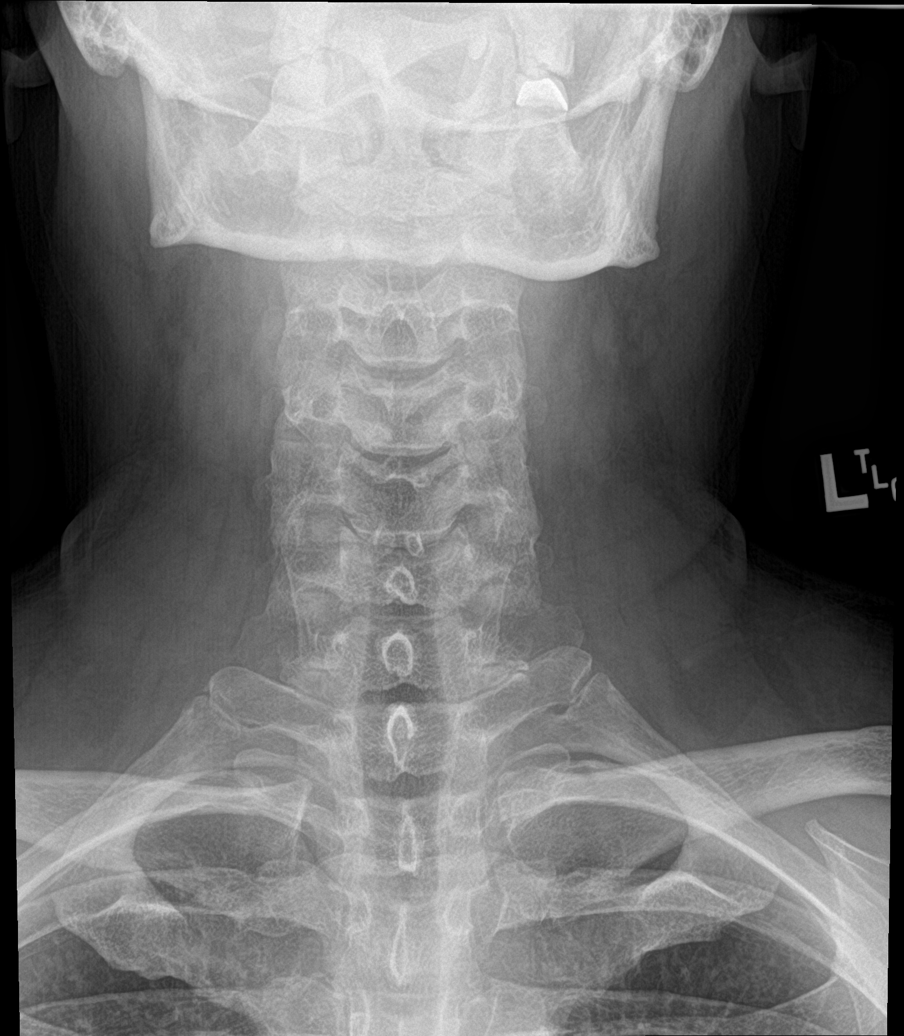

[c-spine open mouth]
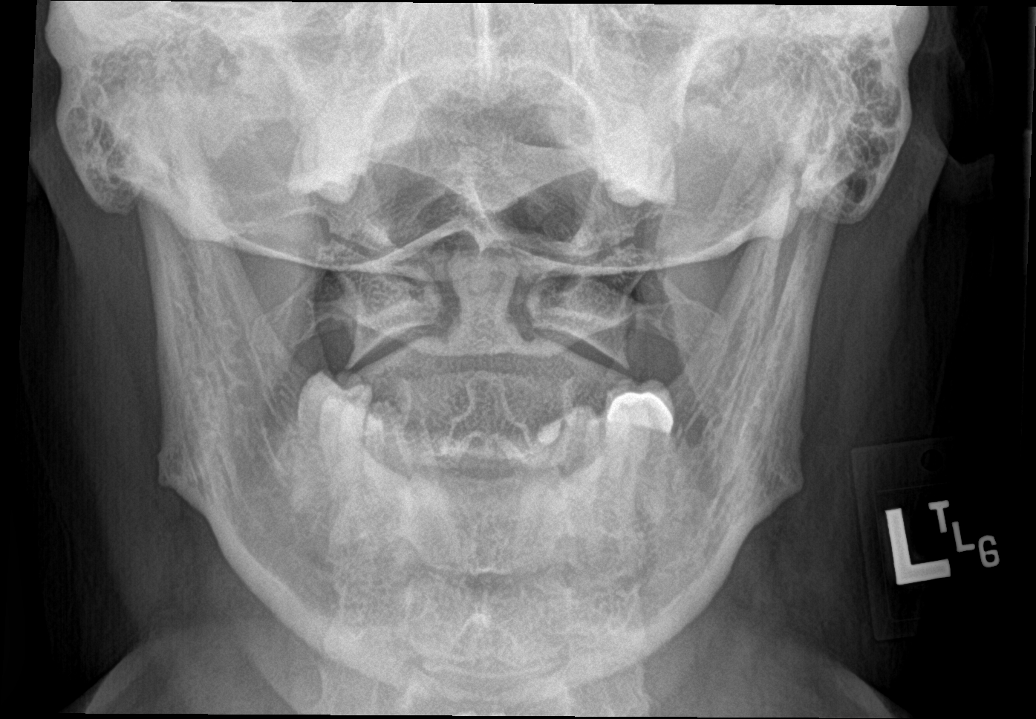

[5 of 5 positions shown; findings below may reference images not displayed]

FINDINGS: Normal alignment. No fracture or mass. Prevertebral soft tissues
normal.

Mild disc degeneration and mild uncinate spurring at C4-5 and C5-6.
Mild right foraminal narrowing at C5-6. Mild left foraminal
narrowing C4-5 and C5-6.
IMPRESSION: Mild cervical spine degenerative changes. Mild right foraminal
narrowing at C5-6.

## 2018-07-17 DIAGNOSIS — Z85038 Personal history of other malignant neoplasm of large intestine: Secondary | ICD-10-CM | POA: Diagnosis not present

## 2018-07-17 DIAGNOSIS — K635 Polyp of colon: Secondary | ICD-10-CM | POA: Diagnosis not present

## 2018-07-17 DIAGNOSIS — D125 Benign neoplasm of sigmoid colon: Secondary | ICD-10-CM | POA: Diagnosis not present

## 2018-08-03 ENCOUNTER — Other Ambulatory Visit: Payer: Self-pay | Admitting: Sports Medicine

## 2018-08-03 ENCOUNTER — Ambulatory Visit (INDEPENDENT_AMBULATORY_CARE_PROVIDER_SITE_OTHER): Payer: BLUE CROSS/BLUE SHIELD | Admitting: Sports Medicine

## 2018-08-03 ENCOUNTER — Encounter: Payer: Self-pay | Admitting: Sports Medicine

## 2018-08-03 VITALS — BP 113/74 | HR 75 | Ht 69.0 in | Wt 201.0 lb

## 2018-08-03 DIAGNOSIS — Z125 Encounter for screening for malignant neoplasm of prostate: Secondary | ICD-10-CM | POA: Diagnosis not present

## 2018-08-03 DIAGNOSIS — Z13220 Encounter for screening for lipoid disorders: Secondary | ICD-10-CM | POA: Diagnosis not present

## 2018-08-03 DIAGNOSIS — R011 Cardiac murmur, unspecified: Secondary | ICD-10-CM

## 2018-08-03 DIAGNOSIS — E559 Vitamin D deficiency, unspecified: Secondary | ICD-10-CM | POA: Diagnosis not present

## 2018-08-03 DIAGNOSIS — Z0000 Encounter for general adult medical examination without abnormal findings: Secondary | ICD-10-CM

## 2018-08-03 NOTE — Assessment & Plan Note (Signed)
Annual physical exam. Labs ordered.

## 2018-08-03 NOTE — Assessment & Plan Note (Addendum)
Suspect mild aortic stenosis, echocardiogram ordered.

## 2018-08-03 NOTE — Progress Notes (Signed)
Subjective:    CC: Annual physical exam  HPI:  Jared Singleton is here for his physical.  I reviewed the past medical history, family history, social history, surgical history, and allergies today and no changes were needed.  Please see the problem list section below in epic for further details.  Past Medical History: No past medical history on file. Past Surgical History: No past surgical history on file. Social History: Social History   Socioeconomic History  . Marital status: Married    Spouse name: Not on file  . Number of children: Not on file  . Years of education: Not on file  . Highest education level: Not on file  Occupational History  . Not on file  Social Needs  . Financial resource strain: Not on file  . Food insecurity:    Worry: Not on file    Inability: Not on file  . Transportation needs:    Medical: Not on file    Non-medical: Not on file  Tobacco Use  . Smoking status: Never Smoker  . Smokeless tobacco: Never Used  Substance and Sexual Activity  . Alcohol use: Yes  . Drug use: Not on file  . Sexual activity: Not on file  Lifestyle  . Physical activity:    Days per week: Not on file    Minutes per session: Not on file  . Stress: Not on file  Relationships  . Social connections:    Talks on phone: Not on file    Gets together: Not on file    Attends religious service: Not on file    Active member of club or organization: Not on file    Attends meetings of clubs or organizations: Not on file    Relationship status: Not on file  Other Topics Concern  . Not on file  Social History Narrative  . Not on file   Family History: Family History  Problem Relation Age of Onset  . Diabetes Mother   . Hypertension Mother   . Heart attack Father   . Hypertension Father   . Alcohol abuse Paternal Aunt   . Cancer Maternal Grandmother    Allergies: No Known Allergies Medications: See med rec.  Review of Systems: No headache, visual changes, nausea,  vomiting, diarrhea, constipation, dizziness, abdominal pain, skin rash, fevers, chills, night sweats, swollen lymph nodes, weight loss, chest pain, body aches, joint swelling, muscle aches, shortness of breath, mood changes, visual or auditory hallucinations.  Objective:    General: Well Developed, well nourished, and in no acute distress.  Neuro: Alert and oriented x3, extra-ocular muscles intact, sensation grossly intact. Cranial nerves II through XII are intact, motor, sensory, and coordinative functions are all intact. HEENT: Normocephalic, atraumatic, pupils equal round reactive to light, neck supple, no masses, no lymphadenopathy, thyroid nonpalpable. Oropharynx, nasopharynx, external ear canals are unremarkable. Skin: Warm and dry, no rashes noted.  Cardiac: Regular rate and rhythm, no rubs or gallops, he does have 8-3/3 systolic ejection murmur heard in the aortic area. Respiratory: Clear to auscultation bilaterally. Not using accessory muscles, speaking in full sentences.  Abdominal: Soft, nontender, nondistended, positive bowel sounds, no masses, no organomegaly.  Musculoskeletal: Shoulder, elbow, wrist, hip, knee, ankle stable, and with full range of motion.  Impression and Recommendations:    The patient was counselled, risk factors were discussed, anticipatory guidance given.  Annual physical exam Annual physical exam. Labs ordered.  Systolic murmur Suspect mild aortic stenosis, echocardiogram ordered. ___________________________________________ Gwen Her. Dianah Field, M.D., ABFM., CAQSM. Primary  Care and [place] Professor of [place] of [facility] of Medicine

## 2018-08-04 ENCOUNTER — Telehealth: Payer: Self-pay | Admitting: *Deleted

## 2018-08-04 DIAGNOSIS — E559 Vitamin D deficiency, unspecified: Secondary | ICD-10-CM

## 2018-08-04 NOTE — Telephone Encounter (Signed)
Vitamin D lab requisition ordered per pt request.

## 2018-08-07 ENCOUNTER — Other Ambulatory Visit: Payer: Self-pay | Admitting: Sports Medicine

## 2018-08-07 LAB — LIPID PANEL W/REFLEX DIRECT LDL
Cholesterol: 190 mg/dL (ref <200)
HDL: 40 mg/dL — ABNORMAL LOW (ref >40)
LDL Cholesterol (Calc): 132 mg/dL — ABNORMAL HIGH
Non-HDL Cholesterol (Calc): 150 mg/dL — ABNORMAL HIGH (ref <130)
Total CHOL/HDL Ratio: 4.8 (calc) (ref <5.0)
Triglycerides: 79 mg/dL (ref <150)

## 2018-08-07 LAB — CBC
HCT: 42.8 % (ref 38.5–50.0)
Hemoglobin: 14.8 g/dL (ref 13.2–17.1)
MCH: 30.5 pg (ref 27.0–33.0)
MCHC: 34.6 g/dL (ref 32.0–36.0)
MCV: 88.2 fL (ref 80.0–100.0)
MPV: 9.5 fL (ref 7.5–12.5)
Platelets: 273 10*3/uL (ref 140–400)
RBC: 4.85 10*6/uL (ref 4.20–5.80)
RDW: 13.9 % (ref 11.0–15.0)
WBC: 5.4 10*3/uL (ref 3.8–10.8)

## 2018-08-07 LAB — COMPREHENSIVE METABOLIC PANEL
AST: 20 U/L (ref 10–35)
Albumin: 4.5 g/dL (ref 3.6–5.1)
Alkaline phosphatase (APISO): 55 U/L (ref 40–115)
CO2: 26 mmol/L (ref 20–32)
Calcium: 9.2 mg/dL (ref 8.6–10.3)
Chloride: 106 mmol/L (ref 98–110)
Globulin: 3 g/dL (calc) (ref 1.9–3.7)
Potassium: 4.6 mmol/L (ref 3.5–5.3)
Total Bilirubin: 0.4 mg/dL (ref 0.2–1.2)

## 2018-08-07 LAB — PSA, TOTAL AND FREE
PSA, % Free: 43 % (calc) (ref >25)
PSA, Free: 0.3 ng/mL
PSA, Total: 0.7 ng/mL (ref <=4.0)

## 2018-08-07 LAB — TEST AUTHORIZATION

## 2018-08-07 LAB — COMPREHENSIVE METABOLIC PANEL WITH GFR
AG Ratio: 1.5 (calc) (ref 1.0–2.5)
ALT: 30 U/L (ref 9–46)
BUN: 12 mg/dL (ref 7–25)
Creat: 0.94 mg/dL (ref 0.70–1.33)
Glucose, Bld: 86 mg/dL (ref 65–99)
Sodium: 139 mmol/L (ref 135–146)
Total Protein: 7.5 g/dL (ref 6.1–8.1)

## 2018-08-07 LAB — VITAMIN D 25 HYDROXY (VIT D DEFICIENCY, FRACTURES): Vit D, 25-Hydroxy: 25 ng/mL — ABNORMAL LOW (ref 30–100)

## 2018-08-07 LAB — TSH: TSH: 1.13 m[IU]/L (ref 0.40–4.50)

## 2018-08-07 MED ORDER — VITAMIN D (ERGOCALCIFEROL) 1.25 MG (50000 UNIT) PO CAPS: 50000.0000 [IU] | ORAL_CAPSULE | ORAL | 0 refills | Status: DC

## 2018-08-09 ENCOUNTER — Ambulatory Visit (HOSPITAL_BASED_OUTPATIENT_CLINIC_OR_DEPARTMENT_OTHER): Payer: BLUE CROSS/BLUE SHIELD

## 2018-08-31 DIAGNOSIS — Z9889 Other specified postprocedural states: Secondary | ICD-10-CM | POA: Diagnosis not present

## 2018-08-31 DIAGNOSIS — R911 Solitary pulmonary nodule: Secondary | ICD-10-CM | POA: Diagnosis not present

## 2018-08-31 DIAGNOSIS — C184 Malignant neoplasm of transverse colon: Secondary | ICD-10-CM | POA: Diagnosis not present

## 2018-08-31 DIAGNOSIS — J9811 Atelectasis: Secondary | ICD-10-CM | POA: Diagnosis not present

## 2018-08-31 DIAGNOSIS — K439 Ventral hernia without obstruction or gangrene: Secondary | ICD-10-CM | POA: Diagnosis not present

## 2018-08-31 DIAGNOSIS — N281 Cyst of kidney, acquired: Secondary | ICD-10-CM | POA: Diagnosis not present

## 2018-08-31 DIAGNOSIS — K7689 Other specified diseases of liver: Secondary | ICD-10-CM | POA: Diagnosis not present

## 2018-08-31 DIAGNOSIS — C189 Malignant neoplasm of colon, unspecified: Secondary | ICD-10-CM | POA: Diagnosis not present

## 2018-09-08 DIAGNOSIS — R911 Solitary pulmonary nodule: Secondary | ICD-10-CM | POA: Diagnosis not present

## 2018-09-08 DIAGNOSIS — C772 Secondary and unspecified malignant neoplasm of intra-abdominal lymph nodes: Secondary | ICD-10-CM | POA: Diagnosis not present

## 2018-09-08 DIAGNOSIS — T451X5A Adverse effect of antineoplastic and immunosuppressive drugs, initial encounter: Secondary | ICD-10-CM | POA: Diagnosis not present

## 2018-09-08 DIAGNOSIS — E559 Vitamin D deficiency, unspecified: Secondary | ICD-10-CM | POA: Diagnosis not present

## 2018-09-08 DIAGNOSIS — K432 Incisional hernia without obstruction or gangrene: Secondary | ICD-10-CM | POA: Diagnosis not present

## 2018-09-08 DIAGNOSIS — G620 Drug-induced polyneuropathy: Secondary | ICD-10-CM | POA: Diagnosis not present

## 2018-09-08 DIAGNOSIS — C184 Malignant neoplasm of transverse colon: Secondary | ICD-10-CM | POA: Diagnosis not present

## 2018-09-08 DIAGNOSIS — K631 Perforation of intestine (nontraumatic): Secondary | ICD-10-CM | POA: Diagnosis not present

## 2018-09-30 ENCOUNTER — Other Ambulatory Visit: Payer: Self-pay | Admitting: Sports Medicine

## 2018-11-08 ENCOUNTER — Other Ambulatory Visit: Payer: Self-pay | Admitting: Sports Medicine

## 2018-12-02 ENCOUNTER — Other Ambulatory Visit: Payer: Self-pay | Admitting: Sports Medicine

## 2018-12-08 DIAGNOSIS — R911 Solitary pulmonary nodule: Secondary | ICD-10-CM | POA: Diagnosis not present

## 2018-12-08 DIAGNOSIS — E559 Vitamin D deficiency, unspecified: Secondary | ICD-10-CM | POA: Diagnosis not present

## 2018-12-08 DIAGNOSIS — C772 Secondary and unspecified malignant neoplasm of intra-abdominal lymph nodes: Secondary | ICD-10-CM | POA: Diagnosis not present

## 2018-12-08 DIAGNOSIS — K631 Perforation of intestine (nontraumatic): Secondary | ICD-10-CM | POA: Diagnosis not present

## 2018-12-08 DIAGNOSIS — C184 Malignant neoplasm of transverse colon: Secondary | ICD-10-CM | POA: Diagnosis not present

## 2019-03-09 DIAGNOSIS — K7689 Other specified diseases of liver: Secondary | ICD-10-CM | POA: Diagnosis not present

## 2019-03-09 DIAGNOSIS — C184 Malignant neoplasm of transverse colon: Secondary | ICD-10-CM | POA: Diagnosis not present

## 2019-03-09 DIAGNOSIS — R911 Solitary pulmonary nodule: Secondary | ICD-10-CM | POA: Diagnosis not present

## 2019-03-21 DIAGNOSIS — K432 Incisional hernia without obstruction or gangrene: Secondary | ICD-10-CM | POA: Diagnosis not present

## 2019-03-21 DIAGNOSIS — R911 Solitary pulmonary nodule: Secondary | ICD-10-CM | POA: Diagnosis not present

## 2019-03-21 DIAGNOSIS — C184 Malignant neoplasm of transverse colon: Secondary | ICD-10-CM | POA: Diagnosis not present

## 2019-03-21 DIAGNOSIS — K631 Perforation of intestine (nontraumatic): Secondary | ICD-10-CM | POA: Diagnosis not present

## 2019-03-21 DIAGNOSIS — E559 Vitamin D deficiency, unspecified: Secondary | ICD-10-CM | POA: Diagnosis not present

## 2019-03-21 DIAGNOSIS — Z85038 Personal history of other malignant neoplasm of large intestine: Secondary | ICD-10-CM | POA: Diagnosis not present

## 2019-03-21 DIAGNOSIS — Z08 Encounter for follow-up examination after completed treatment for malignant neoplasm: Secondary | ICD-10-CM | POA: Diagnosis not present

## 2019-06-20 DIAGNOSIS — E559 Vitamin D deficiency, unspecified: Secondary | ICD-10-CM | POA: Diagnosis not present

## 2019-06-20 DIAGNOSIS — G620 Drug-induced polyneuropathy: Secondary | ICD-10-CM | POA: Diagnosis not present

## 2019-06-20 DIAGNOSIS — D500 Iron deficiency anemia secondary to blood loss (chronic): Secondary | ICD-10-CM | POA: Diagnosis not present

## 2019-06-20 DIAGNOSIS — C184 Malignant neoplasm of transverse colon: Secondary | ICD-10-CM | POA: Diagnosis not present

## 2019-06-20 DIAGNOSIS — T451X5A Adverse effect of antineoplastic and immunosuppressive drugs, initial encounter: Secondary | ICD-10-CM | POA: Diagnosis not present

## 2019-06-20 DIAGNOSIS — K432 Incisional hernia without obstruction or gangrene: Secondary | ICD-10-CM | POA: Diagnosis not present

## 2019-06-20 DIAGNOSIS — R911 Solitary pulmonary nodule: Secondary | ICD-10-CM | POA: Diagnosis not present

## 2019-08-06 ENCOUNTER — Encounter: Payer: BLUE CROSS/BLUE SHIELD | Admitting: Sports Medicine

## 2019-09-19 DIAGNOSIS — C189 Malignant neoplasm of colon, unspecified: Secondary | ICD-10-CM | POA: Diagnosis not present

## 2019-09-19 DIAGNOSIS — N281 Cyst of kidney, acquired: Secondary | ICD-10-CM | POA: Diagnosis not present

## 2019-09-19 DIAGNOSIS — J8410 Pulmonary fibrosis, unspecified: Secondary | ICD-10-CM | POA: Diagnosis not present

## 2019-09-21 DIAGNOSIS — T451X5A Adverse effect of antineoplastic and immunosuppressive drugs, initial encounter: Secondary | ICD-10-CM | POA: Diagnosis not present

## 2019-09-21 DIAGNOSIS — E559 Vitamin D deficiency, unspecified: Secondary | ICD-10-CM | POA: Diagnosis not present

## 2019-09-21 DIAGNOSIS — Z85038 Personal history of other malignant neoplasm of large intestine: Secondary | ICD-10-CM | POA: Diagnosis not present

## 2019-09-21 DIAGNOSIS — K631 Perforation of intestine (nontraumatic): Secondary | ICD-10-CM | POA: Diagnosis not present

## 2019-09-21 DIAGNOSIS — G620 Drug-induced polyneuropathy: Secondary | ICD-10-CM | POA: Diagnosis not present

## 2019-09-21 DIAGNOSIS — K432 Incisional hernia without obstruction or gangrene: Secondary | ICD-10-CM | POA: Diagnosis not present

## 2019-09-21 DIAGNOSIS — Z08 Encounter for follow-up examination after completed treatment for malignant neoplasm: Secondary | ICD-10-CM | POA: Diagnosis not present

## 2019-09-21 DIAGNOSIS — C184 Malignant neoplasm of transverse colon: Secondary | ICD-10-CM | POA: Diagnosis not present

## 2019-09-21 DIAGNOSIS — R911 Solitary pulmonary nodule: Secondary | ICD-10-CM | POA: Diagnosis not present

## 2019-09-21 DIAGNOSIS — D500 Iron deficiency anemia secondary to blood loss (chronic): Secondary | ICD-10-CM | POA: Diagnosis not present

## 2019-09-22 DIAGNOSIS — E559 Vitamin D deficiency, unspecified: Secondary | ICD-10-CM | POA: Insufficient documentation

## 2019-12-20 DIAGNOSIS — E559 Vitamin D deficiency, unspecified: Secondary | ICD-10-CM | POA: Diagnosis not present

## 2020-03-18 DIAGNOSIS — K760 Fatty (change of) liver, not elsewhere classified: Secondary | ICD-10-CM | POA: Diagnosis not present

## 2020-03-18 DIAGNOSIS — K769 Liver disease, unspecified: Secondary | ICD-10-CM | POA: Diagnosis not present

## 2020-03-18 DIAGNOSIS — N281 Cyst of kidney, acquired: Secondary | ICD-10-CM | POA: Diagnosis not present

## 2020-03-18 DIAGNOSIS — C184 Malignant neoplasm of transverse colon: Secondary | ICD-10-CM | POA: Diagnosis not present

## 2020-03-18 DIAGNOSIS — C189 Malignant neoplasm of colon, unspecified: Secondary | ICD-10-CM | POA: Diagnosis not present

## 2020-03-31 DIAGNOSIS — Z9049 Acquired absence of other specified parts of digestive tract: Secondary | ICD-10-CM | POA: Diagnosis not present

## 2020-03-31 DIAGNOSIS — D500 Iron deficiency anemia secondary to blood loss (chronic): Secondary | ICD-10-CM | POA: Diagnosis not present

## 2020-03-31 DIAGNOSIS — G620 Drug-induced polyneuropathy: Secondary | ICD-10-CM | POA: Diagnosis not present

## 2020-03-31 DIAGNOSIS — T451X5A Adverse effect of antineoplastic and immunosuppressive drugs, initial encounter: Secondary | ICD-10-CM | POA: Diagnosis not present

## 2020-03-31 DIAGNOSIS — C184 Malignant neoplasm of transverse colon: Secondary | ICD-10-CM | POA: Diagnosis not present

## 2020-03-31 DIAGNOSIS — R911 Solitary pulmonary nodule: Secondary | ICD-10-CM | POA: Diagnosis not present

## 2020-07-04 ENCOUNTER — Ambulatory Visit (INDEPENDENT_AMBULATORY_CARE_PROVIDER_SITE_OTHER): Payer: BC Managed Care – PPO | Admitting: Sports Medicine

## 2020-07-04 ENCOUNTER — Encounter: Payer: Self-pay | Admitting: Sports Medicine

## 2020-07-04 VITALS — BP 114/76 | HR 74 | Ht 69.0 in | Wt 202.0 lb

## 2020-07-04 DIAGNOSIS — Z0000 Encounter for general adult medical examination without abnormal findings: Secondary | ICD-10-CM

## 2020-07-04 DIAGNOSIS — N139 Obstructive and reflux uropathy, unspecified: Secondary | ICD-10-CM

## 2020-07-04 DIAGNOSIS — C184 Malignant neoplasm of transverse colon: Secondary | ICD-10-CM | POA: Diagnosis not present

## 2020-07-04 DIAGNOSIS — Z23 Encounter for immunization: Secondary | ICD-10-CM | POA: Diagnosis not present

## 2020-07-04 DIAGNOSIS — D500 Iron deficiency anemia secondary to blood loss (chronic): Secondary | ICD-10-CM | POA: Diagnosis not present

## 2020-07-04 MED ORDER — HYOSCYAMINE SULFATE ER 0.375 MG PO TB12: 0.3750 mg | ORAL_TABLET | Freq: Two times a day (BID) | ORAL | 11 refills | Status: DC

## 2020-07-04 NOTE — Progress Notes (Signed)
Subjective:    CC: Annual Physical Exam  HPI:  This patient is here for their annual physical  I reviewed the past medical history, family history, social history, surgical history, and allergies today and no changes were needed.  Please see the problem list section below in epic for further details.  Past Medical History: No past medical history on file. Past Surgical History: No past surgical history on file. Social History: Social History   Socioeconomic History  . Marital status: Married    Spouse name: Not on file  . Number of children: Not on file  . Years of education: Not on file  . Highest education level: Not on file  Occupational History  . Not on file  Tobacco Use  . Smoking status: Never Smoker  . Smokeless tobacco: Never Used  Substance and Sexual Activity  . Alcohol use: Yes  . Drug use: Not on file  . Sexual activity: Not on file  Other Topics Concern  . Not on file  Social History Narrative  . Not on file   Social Determinants of Health   Financial Resource Strain:   . Difficulty of Paying Living Expenses: Not on file  Food Insecurity:   . Worried About Charity fundraiser in the Last Year: Not on file  . Ran Out of Food in the Last Year: Not on file  Transportation Needs:   . Lack of Transportation (Medical): Not on file  . Lack of Transportation (Non-Medical): Not on file  Physical Activity:   . Days of Exercise per Week: Not on file  . Minutes of Exercise per Session: Not on file  Stress:   . Feeling of Stress : Not on file  Social Connections:   . Frequency of Communication with Friends and Family: Not on file  . Frequency of Social Gatherings with Friends and Family: Not on file  . Attends Religious Services: Not on file  . Active Member of Clubs or Organizations: Not on file  . Attends Archivist Meetings: Not on file  . Marital Status: Not on file   Family History: Family History  Problem Relation Age of Onset  .  Diabetes Mother   . Hypertension Mother   . Heart attack Father   . Hypertension Father   . Alcohol abuse Paternal Aunt   . Cancer Maternal Grandmother    Allergies: No Known Allergies Medications: See med rec.  Review of Systems: No headache, visual changes, nausea, vomiting, diarrhea, constipation, dizziness, abdominal pain, skin rash, fevers, chills, night sweats, swollen lymph nodes, weight loss, chest pain, body aches, joint swelling, muscle aches, shortness of breath, mood changes, visual or auditory hallucinations.  Objective:    General: Well Developed, well nourished, and in no acute distress.  Neuro: Alert and oriented x3, extra-ocular muscles intact, sensation grossly intact. Cranial nerves II through XII are intact, motor, sensory, and coordinative functions are all intact. HEENT: Normocephalic, atraumatic, pupils equal round reactive to light, neck supple, no masses, no lymphadenopathy, thyroid nonpalpable. Oropharynx, nasopharynx, external ear canals are unremarkable. Skin: Warm and dry, no rashes noted.  Cardiac: Regular rate and rhythm, no murmurs rubs or gallops.  Respiratory: Clear to auscultation bilaterally. Not using accessory muscles, speaking in full sentences.  Abdominal: Soft, nontender, nondistended, positive bowel sounds, no masses, no organomegaly.  Musculoskeletal: Shoulder, elbow, wrist, hip, knee, ankle stable, and with full range of motion.  Impression and Recommendations:    The patient was counselled, risk factors were discussed, anticipatory  guidance given.  Annual physical exam Routine annual physical as above, checking routine labs. Shingrix No. 1 today, 2 to 6 months he can have #2.  Malignant neoplasm of transverse colon ([place]) With history of hemicolectomy, he is suffering from short bowel syndrome with loose stools 2-3 times daily, adding Levbid. We can follow this up in a month on a virtual  visit.   ___________________________________________ Gwen Her. Dianah Field, M.D., ABFM., CAQSM. Primary Care and Sports Medicine [facility]  Adjunct Professor of [place] of [facility] of Medicine

## 2020-07-04 NOTE — Addendum Note (Signed)
Addended by: Dema Severin on: 07/04/2020 09:55 AM   Modules accepted: Orders

## 2020-07-04 NOTE — Assessment & Plan Note (Signed)
Routine annual physical as above, checking routine labs. Shingrix No. 1 today, 2 to 6 months he can have #2.

## 2020-07-04 NOTE — Assessment & Plan Note (Signed)
With history of hemicolectomy, he is suffering from short bowel syndrome with loose stools 2-3 times daily, adding Levbid. We can follow this up in a month on a virtual visit.

## 2020-07-07 ENCOUNTER — Encounter: Payer: Self-pay | Admitting: Sports Medicine

## 2020-07-07 LAB — CBC
HCT: 43.9 % (ref 38.5–50.0)
Hemoglobin: 15.2 g/dL (ref 13.2–17.1)
MCH: 31.1 pg (ref 27.0–33.0)
MCHC: 34.6 g/dL (ref 32.0–36.0)
MCV: 90 fL (ref 80.0–100.0)
MPV: 9.5 fL (ref 7.5–12.5)
Platelets: 261 10*3/uL (ref 140–400)
RBC: 4.88 10*6/uL (ref 4.20–5.80)
RDW: 13.3 % (ref 11.0–15.0)
WBC: 5.7 10*3/uL (ref 3.8–10.8)

## 2020-07-07 LAB — LIPID PANEL
Cholesterol: 205 mg/dL — ABNORMAL HIGH (ref <200)
HDL: 48 mg/dL (ref >=40)
LDL Cholesterol (Calc): 138 mg/dL (calc) — ABNORMAL HIGH
Non-HDL Cholesterol (Calc): 157 mg/dL (calc) — ABNORMAL HIGH (ref <130)
Total CHOL/HDL Ratio: 4.3 (calc) (ref <5.0)
Triglycerides: 87 mg/dL (ref <150)

## 2020-07-07 LAB — COMPREHENSIVE METABOLIC PANEL
AG Ratio: 1.7 (calc) (ref 1.0–2.5)
ALT: 33 U/L (ref 9–46)
AST: 20 U/L (ref 10–35)
Albumin: 4.4 g/dL (ref 3.6–5.1)
Alkaline phosphatase (APISO): 48 U/L (ref 35–144)
BUN: 12 mg/dL (ref 7–25)
CO2: 27 mmol/L (ref 20–32)
Calcium: 9.5 mg/dL (ref 8.6–10.3)
Chloride: 104 mmol/L (ref 98–110)
Creat: 1.02 mg/dL (ref 0.70–1.33)
Globulin: 2.6 g/dL (calc) (ref 1.9–3.7)
Glucose, Bld: 90 mg/dL (ref 65–99)
Potassium: 4.6 mmol/L (ref 3.5–5.3)
Sodium: 138 mmol/L (ref 135–146)
Total Bilirubin: 0.4 mg/dL (ref 0.2–1.2)
Total Protein: 7 g/dL (ref 6.1–8.1)

## 2020-07-07 LAB — PSA, TOTAL AND FREE
PSA, % Free: 38 % (calc) (ref >25)
PSA, Free: 0.6 ng/mL
PSA, Total: 1.6 ng/mL (ref <=4.0)

## 2020-07-07 LAB — TSH: TSH: 1.09 mIU/L (ref 0.40–4.50)

## 2020-07-07 LAB — VITAMIN D 25 HYDROXY (VIT D DEFICIENCY, FRACTURES): Vit D, 25-Hydroxy: 54 ng/mL (ref 30–100)

## 2020-10-10 ENCOUNTER — Ambulatory Visit: Payer: BC Managed Care – PPO

## 2020-10-15 DIAGNOSIS — N281 Cyst of kidney, acquired: Secondary | ICD-10-CM | POA: Diagnosis not present

## 2020-10-15 DIAGNOSIS — C189 Malignant neoplasm of colon, unspecified: Secondary | ICD-10-CM | POA: Diagnosis not present

## 2020-10-15 DIAGNOSIS — J918 Pleural effusion in other conditions classified elsewhere: Secondary | ICD-10-CM | POA: Diagnosis not present

## 2020-10-15 DIAGNOSIS — C184 Malignant neoplasm of transverse colon: Secondary | ICD-10-CM | POA: Diagnosis not present

## 2020-10-17 ENCOUNTER — Ambulatory Visit (INDEPENDENT_AMBULATORY_CARE_PROVIDER_SITE_OTHER): Payer: BC Managed Care – PPO | Admitting: Sports Medicine

## 2020-10-17 ENCOUNTER — Other Ambulatory Visit: Payer: Self-pay

## 2020-10-17 VITALS — BP 126/70 | HR 74 | Temp 97.2°F | Wt 209.0 lb

## 2020-10-17 DIAGNOSIS — G620 Drug-induced polyneuropathy: Secondary | ICD-10-CM | POA: Diagnosis not present

## 2020-10-17 DIAGNOSIS — Z23 Encounter for immunization: Secondary | ICD-10-CM

## 2020-10-17 DIAGNOSIS — T451X5A Adverse effect of antineoplastic and immunosuppressive drugs, initial encounter: Secondary | ICD-10-CM | POA: Diagnosis not present

## 2020-10-17 DIAGNOSIS — C184 Malignant neoplasm of transverse colon: Secondary | ICD-10-CM | POA: Diagnosis not present

## 2020-10-17 DIAGNOSIS — D500 Iron deficiency anemia secondary to blood loss (chronic): Secondary | ICD-10-CM | POA: Diagnosis not present

## 2020-10-17 DIAGNOSIS — D509 Iron deficiency anemia, unspecified: Secondary | ICD-10-CM | POA: Diagnosis not present

## 2020-10-17 DIAGNOSIS — J45909 Unspecified asthma, uncomplicated: Secondary | ICD-10-CM | POA: Diagnosis not present

## 2020-10-17 DIAGNOSIS — R911 Solitary pulmonary nodule: Secondary | ICD-10-CM | POA: Diagnosis not present

## 2020-10-17 DIAGNOSIS — E559 Vitamin D deficiency, unspecified: Secondary | ICD-10-CM | POA: Diagnosis not present

## 2020-10-17 NOTE — Progress Notes (Signed)
Patient is here for 2nd Shingles vaccine. Denies chest pain, shortness of breath, headaches and problems with medication or mood changes.  Patient tolerated injection well without complications.  No further visits required.  

## 2020-11-07 ENCOUNTER — Other Ambulatory Visit: Payer: Self-pay

## 2020-11-07 MED ORDER — ALBUTEROL SULFATE HFA 108 (90 BASE) MCG/ACT IN AERS: 2.0000 | INHALATION_SPRAY | Freq: Four times a day (QID) | RESPIRATORY_TRACT | 0 refills | Status: DC | PRN

## 2020-11-07 MED ORDER — FLOVENT HFA 110 MCG/ACT IN AERO
INHALATION_SPRAY | RESPIRATORY_TRACT | 0 refills | Status: DC
Start: 2020-11-07 — End: 2021-11-16

## 2020-11-07 NOTE — Telephone Encounter (Signed)
Patient called to request new prescriptions for his inhalers per his pharmacy. New prescriptions sent as requested.

## 2021-01-16 DIAGNOSIS — D500 Iron deficiency anemia secondary to blood loss (chronic): Secondary | ICD-10-CM | POA: Diagnosis not present

## 2021-01-16 DIAGNOSIS — C184 Malignant neoplasm of transverse colon: Secondary | ICD-10-CM | POA: Diagnosis not present

## 2021-04-20 DIAGNOSIS — N281 Cyst of kidney, acquired: Secondary | ICD-10-CM | POA: Diagnosis not present

## 2021-04-20 DIAGNOSIS — K7689 Other specified diseases of liver: Secondary | ICD-10-CM | POA: Diagnosis not present

## 2021-04-20 DIAGNOSIS — C184 Malignant neoplasm of transverse colon: Secondary | ICD-10-CM | POA: Diagnosis not present

## 2021-04-20 DIAGNOSIS — K760 Fatty (change of) liver, not elsewhere classified: Secondary | ICD-10-CM | POA: Diagnosis not present

## 2021-05-01 DIAGNOSIS — C184 Malignant neoplasm of transverse colon: Secondary | ICD-10-CM | POA: Diagnosis not present

## 2021-07-06 ENCOUNTER — Encounter: Payer: Self-pay | Admitting: Family Medicine

## 2021-07-06 ENCOUNTER — Other Ambulatory Visit: Payer: Self-pay

## 2021-07-06 ENCOUNTER — Ambulatory Visit: Payer: BC Managed Care – PPO | Admitting: Family Medicine

## 2021-07-06 VITALS — BP 127/71 | HR 69 | Ht 69.0 in | Wt 211.0 lb

## 2021-07-06 DIAGNOSIS — H938X1 Other specified disorders of right ear: Secondary | ICD-10-CM

## 2021-07-06 NOTE — Patient Instructions (Signed)
Recommend trial of an antihistamine like Claritin or Allegra or Zyrtec. Decrease Q-tip use/frequency.

## 2021-07-06 NOTE — Progress Notes (Signed)
Acute Office Visit  Subjective:    Patient ID: Jared Singleton, male    DOB: 08-24-1965, 55 y.o.   MRN: [id number]  Chief Complaint  Patient presents with   Ear Pain    R ear x 1 wk feels irritated and itchy    HPI Patient is in today for right ear feels irritated and itchy x1 week.  No recent colds or congestion.  He says he does normally get a sore throat around this time a year but has not had one yet.  He has not noticed any drainage from the ear.  He does clean frequently with Q-tips but denies any trauma.  Itching in the throat or other sinus symptoms.  No past medical history on file.  No past surgical history on file.  Family History  Problem Relation Age of Onset   Diabetes Mother    Hypertension Mother    Heart attack Father    Hypertension Father    Alcohol abuse Paternal Aunt    Cancer Maternal Grandmother     Social History   Socioeconomic History   Marital status: Married    Spouse name: Not on file   Number of children: Not on file   Years of education: Not on file   Highest education level: Not on file  Occupational History   Not on file  Tobacco Use   Smoking status: Never   Smokeless tobacco: Never  Substance and Sexual Activity   Alcohol use: Yes   Drug use: Not on file   Sexual activity: Not on file  Other Topics Concern   Not on file  Social History Narrative   Not on file   Social Determinants of Health   Financial Resource Strain: Not on file  Food Insecurity: Not on file  Transportation Needs: Not on file  Physical Activity: Not on file  Stress: Not on file  Social Connections: Not on file  Intimate Partner Violence: Not on file    Outpatient Medications Prior to Visit  Medication Sig Dispense Refill   albuterol (PROVENTIL HFA) 108 (90 Base) MCG/ACT inhaler Inhale 2 puffs into the lungs every 6 (six) hours as needed for wheezing or shortness of breath. 6.7 each 0   fluticasone (FLOVENT HFA) 110 MCG/ACT inhaler TAKE 2 PUFFS BY  MOUTH TWICE A DAY 12 each 0   hyoscyamine (LEVBID) 0.375 MG 12 hr tablet Take 1 tablet (0.375 mg total) by mouth 2 (two) times daily. 60 tablet 11   VITAMIN D PO Take by mouth.     No facility-administered medications prior to visit.    Allergies  Allergen Reactions   Oxycodone Other (See Comments)    Tremors, hallucinations    Review of Systems     Objective:    Physical Exam Constitutional:      Appearance: Normal appearance.  HENT:     Head: Normocephalic and atraumatic.     Right Ear: Tympanic membrane, ear canal and external ear normal.     Left Ear: Tympanic membrane, ear canal and external ear normal.  Neurological:     Mental Status: He is alert.    BP 127/71   Pulse 69   Ht 5\' 9"  (1.753 m)   Wt 211 lb (95.7 kg)   SpO2 97%   BMI 31.16 kg/m  Wt Readings from Last 3 Encounters:  07/06/21 211 lb (95.7 kg)  10/17/20 209 lb (94.8 kg)  07/04/20 202 lb (91.6 kg)    Health Maintenance Due  Topic Date Due   Pneumococcal Vaccine 23-88 Years old (1 - PCV) Never done   Hepatitis C Screening  Never done   COVID-19 Vaccine (3 - Pfizer risk series) 01/19/2020   INFLUENZA VACCINE  03/23/2021    There are no preventive care reminders to display for this patient.   Lab Results  Component Value Date   TSH 1.09 07/04/2020   Lab Results  Component Value Date   WBC 5.7 07/04/2020   HGB 15.2 07/04/2020   HCT 43.9 07/04/2020   MCV 90.0 07/04/2020   PLT 261 07/04/2020   Lab Results  Component Value Date   NA 138 07/04/2020   K 4.6 07/04/2020   CO2 27 07/04/2020   GLUCOSE 90 07/04/2020   BUN 12 07/04/2020   CREATININE 1.02 07/04/2020   BILITOT 0.4 07/04/2020   ALKPHOS 44 07/29/2015   AST 20 07/04/2020   ALT 33 07/04/2020   PROT 7.0 07/04/2020   ALBUMIN 3.9 07/29/2015   CALCIUM 9.5 07/04/2020   Lab Results  Component Value Date   CHOL 205 (H) 07/04/2020   Lab Results  Component Value Date   HDL 48 07/04/2020   Lab Results  Component Value Date    LDLCALC 138 (H) 07/04/2020   Lab Results  Component Value Date   TRIG 87 07/04/2020   Lab Results  Component Value Date   CHOLHDL 4.3 07/04/2020   Lab Results  Component Value Date   HGBA1C 5.1 06/28/2017       Assessment & Plan:   Problem List Items Addressed This Visit   None Visit Diagnoses     Irritation of right ear    -  Primary      Irritation in the right ear right ear irritation/itching-we discussed that it is likely either from allergies or from over cleaning of the ear.  Discussed decreasing Q-tip use from daily to maybe once a week and allowing a little bit of wax formation to coat that canal again.  And also trial of an antihistamine.  If not improving or pain is worsening or has drainage or discharge then please let us know.  Flu shot given today.   No orders of the defined types were placed in this encounter.    Beatrice Lecher, MD

## 2021-07-21 ENCOUNTER — Telehealth: Payer: Self-pay | Admitting: Sports Medicine

## 2021-07-21 DIAGNOSIS — C184 Malignant neoplasm of transverse colon: Secondary | ICD-10-CM

## 2021-07-21 MED ORDER — HYOSCYAMINE SULFATE ER 0.375 MG PO TB12: 0.3750 mg | ORAL_TABLET | Freq: Two times a day (BID) | ORAL | 11 refills | Status: DC

## 2021-07-21 NOTE — Telephone Encounter (Signed)
Pt  called.  He wants a refill on is [employer] med.

## 2021-07-22 NOTE — Telephone Encounter (Signed)
Patient aware this was done yesterday.

## 2021-08-14 ENCOUNTER — Encounter: Payer: BC Managed Care – PPO | Admitting: Sports Medicine

## 2021-08-20 DIAGNOSIS — C184 Malignant neoplasm of transverse colon: Secondary | ICD-10-CM | POA: Diagnosis not present

## 2021-09-21 DIAGNOSIS — Z85038 Personal history of other malignant neoplasm of large intestine: Secondary | ICD-10-CM | POA: Diagnosis not present

## 2021-09-21 DIAGNOSIS — Z1211 Encounter for screening for malignant neoplasm of colon: Secondary | ICD-10-CM | POA: Diagnosis not present

## 2021-09-21 DIAGNOSIS — Z08 Encounter for follow-up examination after completed treatment for malignant neoplasm: Secondary | ICD-10-CM | POA: Diagnosis not present

## 2021-11-02 DIAGNOSIS — K7689 Other specified diseases of liver: Secondary | ICD-10-CM | POA: Diagnosis not present

## 2021-11-02 DIAGNOSIS — K760 Fatty (change of) liver, not elsewhere classified: Secondary | ICD-10-CM | POA: Diagnosis not present

## 2021-11-02 DIAGNOSIS — N281 Cyst of kidney, acquired: Secondary | ICD-10-CM | POA: Diagnosis not present

## 2021-11-02 DIAGNOSIS — C184 Malignant neoplasm of transverse colon: Secondary | ICD-10-CM | POA: Diagnosis not present

## 2021-11-02 DIAGNOSIS — J8410 Pulmonary fibrosis, unspecified: Secondary | ICD-10-CM | POA: Diagnosis not present

## 2021-11-06 DIAGNOSIS — D500 Iron deficiency anemia secondary to blood loss (chronic): Secondary | ICD-10-CM | POA: Diagnosis not present

## 2021-11-06 DIAGNOSIS — E559 Vitamin D deficiency, unspecified: Secondary | ICD-10-CM | POA: Diagnosis not present

## 2021-11-06 DIAGNOSIS — R911 Solitary pulmonary nodule: Secondary | ICD-10-CM | POA: Diagnosis not present

## 2021-11-06 DIAGNOSIS — G620 Drug-induced polyneuropathy: Secondary | ICD-10-CM | POA: Diagnosis not present

## 2021-11-06 DIAGNOSIS — C184 Malignant neoplasm of transverse colon: Secondary | ICD-10-CM | POA: Diagnosis not present

## 2021-11-06 DIAGNOSIS — T451X5A Adverse effect of antineoplastic and immunosuppressive drugs, initial encounter: Secondary | ICD-10-CM | POA: Diagnosis not present

## 2021-11-16 ENCOUNTER — Other Ambulatory Visit: Payer: Self-pay

## 2021-11-16 MED ORDER — ALBUTEROL SULFATE HFA 108 (90 BASE) MCG/ACT IN AERS: 2.0000 | INHALATION_SPRAY | Freq: Four times a day (QID) | RESPIRATORY_TRACT | 0 refills | Status: DC | PRN

## 2021-11-16 MED ORDER — FLUTICASONE PROPIONATE HFA 110 MCG/ACT IN AERO: INHALATION_SPRAY | RESPIRATORY_TRACT | 0 refills | Status: DC

## 2021-12-04 ENCOUNTER — Encounter: Payer: BC Managed Care – PPO | Admitting: Sports Medicine

## 2022-05-17 DIAGNOSIS — C184 Malignant neoplasm of transverse colon: Secondary | ICD-10-CM | POA: Diagnosis not present

## 2022-05-17 DIAGNOSIS — D500 Iron deficiency anemia secondary to blood loss (chronic): Secondary | ICD-10-CM | POA: Diagnosis not present

## 2022-05-17 DIAGNOSIS — R911 Solitary pulmonary nodule: Secondary | ICD-10-CM | POA: Diagnosis not present

## 2022-06-15 ENCOUNTER — Telehealth: Payer: Self-pay | Admitting: *Deleted

## 2022-06-15 NOTE — Chronic Care Management (AMB) (Signed)
  Care Coordination   Note   06/15/2022 Name: Jared Singleton MRN: [id number] DOB: 1966/04/28  [name] is a 56 y.o. year old male who sees Thekkekandam, Gwen Her, MD for primary care. I reached out to [name] by phone today to offer care coordination services.  [name] was given information about Care Coordination services today including:   The Care Coordination services include support from the care team which includes your Nurse Coordinator, Clinical Social Worker, or Pharmacist.  The Care Coordination team is here to help remove barriers to the health concerns and goals most important to you. Care Coordination services are voluntary, and the patient may decline or stop services at any time by request to their care team member.   Care Coordination Consent Status: Patient agreed to services and verbal consent obtained.   Follow up plan:  Telephone appointment with care coordination team member scheduled for:  06/16/2022  Encounter Outcome:  Pt. Scheduled  Julian Hy, [place] Direct Dial: [phone]

## 2022-06-16 ENCOUNTER — Ambulatory Visit: Payer: Self-pay

## 2022-06-16 NOTE — Patient Outreach (Signed)
  Care Coordination   Initial Visit Note   06/16/2022 Name: Jared Singleton MRN: [id number] DOB: [birth year]  Jared Singleton is a 56 y.o. year old male who sees Thekkekandam, Gwen Her, MD for primary care. I spoke with  Jared Singleton by phone today.  What matters to the patients health and wellness today?  Per review of chart last office visit was 07/06/21 and last office visit with primary care provider was 10/17/20. He states he will call provider office to schedule annual appointment. [name] denies any care coordination, disease management or resource needs at this time.     Goals Addressed             This Visit's Progress    COMPLETED: Care Coordination Activities-no follow up required       Care Coordination Interventions: Discussed care coordination program Encouraged patient to schedule an appointment with primary care provider Encouraged patient to contact care coordinator and/or primary care provider if needs change in the future        SDOH assessments and interventions completed:  Yes  SDOH Interventions Today    Flowsheet Row Most Recent Value  SDOH Interventions   Food Insecurity Interventions Intervention Not Indicated  Housing Interventions Intervention Not Indicated  Transportation Interventions Intervention Not Indicated  Utilities Interventions Intervention Not Indicated     Care Coordination Interventions Activated:  Yes  Care Coordination Interventions:  Yes, provided   Follow up plan: No further intervention required.   Encounter Outcome:  Pt. Visit Completed   Thea Silversmith, RN, MSN, BSN, [place] Coordinator [phone]

## 2022-06-25 DIAGNOSIS — K7689 Other specified diseases of liver: Secondary | ICD-10-CM | POA: Diagnosis not present

## 2022-06-25 DIAGNOSIS — K769 Liver disease, unspecified: Secondary | ICD-10-CM | POA: Diagnosis not present

## 2022-06-25 DIAGNOSIS — N400 Enlarged prostate without lower urinary tract symptoms: Secondary | ICD-10-CM | POA: Diagnosis not present

## 2022-06-25 DIAGNOSIS — R911 Solitary pulmonary nodule: Secondary | ICD-10-CM | POA: Diagnosis not present

## 2022-06-25 DIAGNOSIS — Z85038 Personal history of other malignant neoplasm of large intestine: Secondary | ICD-10-CM | POA: Diagnosis not present

## 2022-06-25 DIAGNOSIS — N281 Cyst of kidney, acquired: Secondary | ICD-10-CM | POA: Diagnosis not present

## 2022-06-25 DIAGNOSIS — J8410 Pulmonary fibrosis, unspecified: Secondary | ICD-10-CM | POA: Diagnosis not present

## 2022-06-25 DIAGNOSIS — K760 Fatty (change of) liver, not elsewhere classified: Secondary | ICD-10-CM | POA: Diagnosis not present

## 2022-08-11 ENCOUNTER — Other Ambulatory Visit: Payer: Self-pay | Admitting: Sports Medicine

## 2022-08-11 DIAGNOSIS — C184 Malignant neoplasm of transverse colon: Secondary | ICD-10-CM

## 2022-09-03 ENCOUNTER — Telehealth: Payer: Self-pay | Admitting: Sports Medicine

## 2022-09-03 DIAGNOSIS — C184 Malignant neoplasm of transverse colon: Secondary | ICD-10-CM

## 2022-09-03 MED ORDER — HYOSCYAMINE SULFATE ER 0.375 MG PO TB12: 0.3750 mg | ORAL_TABLET | Freq: Two times a day (BID) | ORAL | 11 refills | Status: DC

## 2022-09-03 NOTE — Telephone Encounter (Signed)
Patient called his prescription for hydoyamine has expired and needs a new one he is scheduled for 09-27-22

## 2022-09-03 NOTE — Telephone Encounter (Signed)
Sent!

## 2022-09-27 ENCOUNTER — Ambulatory Visit (INDEPENDENT_AMBULATORY_CARE_PROVIDER_SITE_OTHER): Payer: BC Managed Care – PPO | Admitting: Sports Medicine

## 2022-09-27 ENCOUNTER — Ambulatory Visit (INDEPENDENT_AMBULATORY_CARE_PROVIDER_SITE_OTHER): Payer: BC Managed Care – PPO

## 2022-09-27 VITALS — BP 148/95 | HR 70 | Ht 69.0 in | Wt 212.0 lb

## 2022-09-27 DIAGNOSIS — Z23 Encounter for immunization: Secondary | ICD-10-CM | POA: Diagnosis not present

## 2022-09-27 DIAGNOSIS — M19021 Primary osteoarthritis, right elbow: Secondary | ICD-10-CM | POA: Diagnosis not present

## 2022-09-27 DIAGNOSIS — M25521 Pain in right elbow: Secondary | ICD-10-CM | POA: Diagnosis not present

## 2022-09-27 DIAGNOSIS — R030 Elevated blood-pressure reading, without diagnosis of hypertension: Secondary | ICD-10-CM | POA: Diagnosis not present

## 2022-09-27 DIAGNOSIS — R7401 Elevation of levels of liver transaminase levels: Secondary | ICD-10-CM

## 2022-09-27 DIAGNOSIS — R739 Hyperglycemia, unspecified: Secondary | ICD-10-CM

## 2022-09-27 DIAGNOSIS — N139 Obstructive and reflux uropathy, unspecified: Secondary | ICD-10-CM | POA: Diagnosis not present

## 2022-09-27 DIAGNOSIS — E7800 Pure hypercholesterolemia, unspecified: Secondary | ICD-10-CM | POA: Diagnosis not present

## 2022-09-27 DIAGNOSIS — Z0000 Encounter for general adult medical examination without abnormal findings: Secondary | ICD-10-CM | POA: Diagnosis not present

## 2022-09-27 DIAGNOSIS — M778 Other enthesopathies, not elsewhere classified: Secondary | ICD-10-CM | POA: Diagnosis not present

## 2022-09-27 MED ORDER — CELECOXIB 200 MG PO CAPS: ORAL_CAPSULE | ORAL | 2 refills | Status: DC

## 2022-09-27 MED ORDER — ALBUTEROL SULFATE HFA 108 (90 BASE) MCG/ACT IN AERS: 2.0000 | INHALATION_SPRAY | Freq: Four times a day (QID) | RESPIRATORY_TRACT | 0 refills | Status: DC | PRN

## 2022-09-27 MED ORDER — FLUTICASONE PROPIONATE HFA 110 MCG/ACT IN AERO: INHALATION_SPRAY | RESPIRATORY_TRACT | 0 refills | Status: DC

## 2022-09-27 NOTE — Progress Notes (Addendum)
Subjective:    CC: Annual Physical Exam  HPI:  This patient is here for their annual physical  I reviewed the past medical history, family history, social history, surgical history, and allergies today and no changes were needed.  Please see the problem list section below in epic for further details.  Past Medical History: No past medical history on file. Past Surgical History: No past surgical history on file. Social History: Social History   Socioeconomic History   Marital status: Married    Spouse name: Not on file   Number of children: Not on file   Years of education: Not on file   Highest education level: Not on file  Occupational History   Not on file  Tobacco Use   Smoking status: Never   Smokeless tobacco: Never  Substance and Sexual Activity   Alcohol use: Yes   Drug use: Not on file   Sexual activity: Not on file  Other Topics Concern   Not on file  Social History Narrative   Not on file   Social Determinants of Health   Financial Resource Strain: Not on file  Food Insecurity: No Food Insecurity (06/16/2022)   Hunger Vital Sign    Worried About Running Out of Food in the Last Year: Never true    Ran Out of Food in the Last Year: Never true  Transportation Needs: No Transportation Needs (06/16/2022)   PRAPARE - Hydrologist (Medical): No    Lack of Transportation (Non-Medical): No  Physical Activity: Not on file  Stress: Not on file  Social Connections: Not on file   Family History: Family History  Problem Relation Age of Onset   Diabetes Mother    Hypertension Mother    Heart attack Father    Hypertension Father    Alcohol abuse Paternal Aunt    Cancer Maternal Grandmother    Allergies: Allergies  Allergen Reactions   Oxycodone Other (See Comments)    Tremors, hallucinations   Medications: See med rec.  Review of Systems: No headache, visual changes, nausea, vomiting, diarrhea, constipation, dizziness,  abdominal pain, skin rash, fevers, chills, night sweats, swollen lymph nodes, weight loss, chest pain, body aches, joint swelling, muscle aches, shortness of breath, mood changes, visual or auditory hallucinations.  Objective:    General: Well Developed, well nourished, and in no acute distress.  Neuro: Alert and oriented x3, extra-ocular muscles intact, sensation grossly intact. Cranial nerves II through XII are intact, motor, sensory, and coordinative functions are all intact. HEENT: Normocephalic, atraumatic, pupils equal round reactive to light, neck supple, no masses, no lymphadenopathy, thyroid nonpalpable. Oropharynx, nasopharynx, external ear canals are unremarkable. Skin: Warm and dry, no rashes noted.  Cardiac: Regular rate and rhythm, no murmurs rubs or gallops.  Respiratory: Clear to auscultation bilaterally. Not using accessory muscles, speaking in full sentences.  Abdominal: Soft, nontender, nondistended, positive bowel sounds, no masses, no organomegaly.  Musculoskeletal: Shoulder, elbow, wrist, hip, knee, ankle stable, and with full range of motion.  Impression and Recommendations:    The patient was counselled, risk factors were discussed, anticipatory guidance given.  Annual physical exam Annual physical as above, we discussed health maintenance, flu shot today. Return to see me in 1 year for fasting annual physical.  Primary osteoarthritis, right elbow Chronic pain right elbow, tenderness over the anconeus muscle, no tenderness at the medial or lateral epicondyles, olecranon, distal biceps insertion, good motion, good strength. Adding x-rays, Celebrex, home conditioning, return to see  me in 6 weeks, injection if not better.  Elevated blood pressure reading Blood pressure continues to be elevated here in the office, I will have him do ambulatory monitoring and send me a MyChart message.  Transaminitis Noted transaminitis, recheck LFTs in a  month.   ____________________________________________ Gwen Her. Dianah Field, M.D., ABFM., CAQSM., AME. Primary Care and Sports Medicine [facility]  Adjunct Professor of [employer] [employer]  Risk manager

## 2022-09-27 NOTE — Assessment & Plan Note (Signed)
Chronic pain right elbow, tenderness over the anconeus muscle, no tenderness at the medial or lateral epicondyles, olecranon, distal biceps insertion, good motion, good strength. Adding x-rays, Celebrex, home conditioning, return to see me in 6 weeks, injection if not better.

## 2022-09-27 NOTE — Assessment & Plan Note (Signed)
Annual physical as above, we discussed health maintenance, flu shot today. Return to see me in 1 year for fasting annual physical.

## 2022-09-27 NOTE — Assessment & Plan Note (Signed)
Blood pressure continues to be elevated here in the office, I will have him do ambulatory monitoring and send me a MyChart message.

## 2022-09-28 ENCOUNTER — Other Ambulatory Visit: Payer: Self-pay

## 2022-09-28 DIAGNOSIS — R7401 Elevation of levels of liver transaminase levels: Secondary | ICD-10-CM | POA: Insufficient documentation

## 2022-09-28 DIAGNOSIS — R7989 Other specified abnormal findings of blood chemistry: Secondary | ICD-10-CM

## 2022-09-28 LAB — COMPLETE METABOLIC PANEL WITH GFR
AG Ratio: 1.2 (calc) (ref 1.0–2.5)
ALT: 48 U/L — ABNORMAL HIGH (ref 9–46)
AST: 23 U/L (ref 10–35)
Albumin: 4.1 g/dL (ref 3.6–5.1)
Alkaline phosphatase (APISO): 50 U/L (ref 35–144)
BUN: 19 mg/dL (ref 7–25)
CO2: 28 mmol/L (ref 20–32)
Calcium: 9.4 mg/dL (ref 8.6–10.3)
Chloride: 103 mmol/L (ref 98–110)
Creat: 1.03 mg/dL (ref 0.70–1.30)
Globulin: 3.3 g/dL (calc) (ref 1.9–3.7)
Glucose, Bld: 95 mg/dL (ref 65–139)
Potassium: 4.4 mmol/L (ref 3.5–5.3)
Sodium: 139 mmol/L (ref 135–146)
Total Bilirubin: 0.5 mg/dL (ref 0.2–1.2)
Total Protein: 7.4 g/dL (ref 6.1–8.1)
eGFR: 85 mL/min/{1.73_m2} (ref >=60)

## 2022-09-28 LAB — CBC WITH DIFFERENTIAL/PLATELET
Absolute Monocytes: 931 cells/uL (ref 200–950)
Basophils Absolute: 47 cells/uL (ref 0–200)
Basophils Relative: 0.5 %
Eosinophils Absolute: 103 cells/uL (ref 15–500)
Eosinophils Relative: 1.1 %
HCT: 45.8 % (ref 38.5–50.0)
Hemoglobin: 15.7 g/dL (ref 13.2–17.1)
Lymphs Abs: 2679 cells/uL (ref 850–3900)
MCH: 31.3 pg (ref 27.0–33.0)
MCHC: 34.3 g/dL (ref 32.0–36.0)
MCV: 91.2 fL (ref 80.0–100.0)
MPV: 9.4 fL (ref 7.5–12.5)
Monocytes Relative: 9.9 %
Neutro Abs: 5640 cells/uL (ref 1500–7800)
Neutrophils Relative %: 60 %
Platelets: 297 10*3/uL (ref 140–400)
RBC: 5.02 10*6/uL (ref 4.20–5.80)
RDW: 13.3 % (ref 11.0–15.0)
Total Lymphocyte: 28.5 %
WBC: 9.4 10*3/uL (ref 3.8–10.8)

## 2022-09-28 LAB — LIPID PANEL W/REFLEX DIRECT LDL
Cholesterol: 208 mg/dL — ABNORMAL HIGH (ref <200)
HDL: 56 mg/dL (ref >=40)
LDL Cholesterol (Calc): 127 mg/dL (calc) — ABNORMAL HIGH
Non-HDL Cholesterol (Calc): 152 mg/dL (calc) — ABNORMAL HIGH (ref <130)
Total CHOL/HDL Ratio: 3.7 (calc) (ref <5.0)
Triglycerides: 133 mg/dL (ref <150)

## 2022-09-28 LAB — HEMOGLOBIN A1C
Hgb A1c MFr Bld: 5.6 % of total Hgb (ref <5.7)
Mean Plasma Glucose: 114 mg/dL
eAG (mmol/L): 6.3 mmol/L

## 2022-09-28 LAB — PSA: PSA: 1.28 ng/mL (ref <=4.00)

## 2022-09-28 LAB — TSH: TSH: 2.06 mIU/L (ref 0.40–4.50)

## 2022-09-28 NOTE — Assessment & Plan Note (Signed)
Noted transaminitis, recheck LFTs in a month.

## 2022-09-28 NOTE — Addendum Note (Signed)
Addended by: Silverio Decamp on: 09/28/2022 08:50 AM   Modules accepted: Orders

## 2022-09-29 ENCOUNTER — Encounter: Payer: Self-pay | Admitting: Sports Medicine

## 2022-10-26 ENCOUNTER — Other Ambulatory Visit: Payer: Self-pay | Admitting: Sports Medicine

## 2022-11-08 ENCOUNTER — Ambulatory Visit: Payer: BC Managed Care – PPO | Admitting: Sports Medicine

## 2022-12-17 ENCOUNTER — Other Ambulatory Visit: Payer: Self-pay | Admitting: Sports Medicine

## 2023-05-23 DIAGNOSIS — C184 Malignant neoplasm of transverse colon: Secondary | ICD-10-CM | POA: Diagnosis not present

## 2023-05-23 DIAGNOSIS — T451X5A Adverse effect of antineoplastic and immunosuppressive drugs, initial encounter: Secondary | ICD-10-CM | POA: Diagnosis not present

## 2023-05-23 DIAGNOSIS — G620 Drug-induced polyneuropathy: Secondary | ICD-10-CM | POA: Diagnosis not present

## 2023-05-23 DIAGNOSIS — D500 Iron deficiency anemia secondary to blood loss (chronic): Secondary | ICD-10-CM | POA: Diagnosis not present

## 2023-06-24 DIAGNOSIS — J984 Other disorders of lung: Secondary | ICD-10-CM | POA: Diagnosis not present

## 2023-06-24 DIAGNOSIS — C184 Malignant neoplasm of transverse colon: Secondary | ICD-10-CM | POA: Diagnosis not present

## 2023-06-24 DIAGNOSIS — N281 Cyst of kidney, acquired: Secondary | ICD-10-CM | POA: Diagnosis not present

## 2023-06-24 DIAGNOSIS — J8410 Pulmonary fibrosis, unspecified: Secondary | ICD-10-CM | POA: Diagnosis not present

## 2023-06-24 DIAGNOSIS — R911 Solitary pulmonary nodule: Secondary | ICD-10-CM | POA: Diagnosis not present

## 2023-06-24 DIAGNOSIS — K760 Fatty (change of) liver, not elsewhere classified: Secondary | ICD-10-CM | POA: Diagnosis not present

## 2023-08-23 ENCOUNTER — Ambulatory Visit (INDEPENDENT_AMBULATORY_CARE_PROVIDER_SITE_OTHER): Payer: BC Managed Care – PPO | Admitting: Sports Medicine

## 2023-08-23 ENCOUNTER — Encounter: Payer: Self-pay | Admitting: Sports Medicine

## 2023-08-23 ENCOUNTER — Ambulatory Visit: Payer: BC Managed Care – PPO

## 2023-08-23 VITALS — BP 149/97 | HR 75 | Ht 69.0 in | Wt 216.0 lb

## 2023-08-23 DIAGNOSIS — Z0000 Encounter for general adult medical examination without abnormal findings: Secondary | ICD-10-CM

## 2023-08-23 DIAGNOSIS — G8929 Other chronic pain: Secondary | ICD-10-CM | POA: Diagnosis not present

## 2023-08-23 DIAGNOSIS — N139 Obstructive and reflux uropathy, unspecified: Secondary | ICD-10-CM

## 2023-08-23 DIAGNOSIS — M25572 Pain in left ankle and joints of left foot: Secondary | ICD-10-CM | POA: Diagnosis not present

## 2023-08-23 DIAGNOSIS — E782 Mixed hyperlipidemia: Secondary | ICD-10-CM

## 2023-08-23 DIAGNOSIS — M7662 Achilles tendinitis, left leg: Secondary | ICD-10-CM | POA: Diagnosis not present

## 2023-08-23 DIAGNOSIS — M7732 Calcaneal spur, left foot: Secondary | ICD-10-CM | POA: Diagnosis not present

## 2023-08-23 DIAGNOSIS — Z23 Encounter for immunization: Secondary | ICD-10-CM

## 2023-08-23 MED ORDER — MELOXICAM 15 MG PO TABS: ORAL_TABLET | ORAL | 3 refills | Status: DC

## 2023-08-23 NOTE — Assessment & Plan Note (Addendum)
 Elevated last year, rechecking, if still elevated we will do a low-dose Crestor.   Lipids are quite elevated, we will add low-dose Crestor, recheck fasting lipids in 2 to 3 months.

## 2023-08-23 NOTE — Progress Notes (Addendum)
 Subjective:    CC: Annual Physical Exam  HPI:  This patient is here for their annual physical  I reviewed the past medical history, family history, social history, surgical history, and allergies today and no changes were needed.  Please see the problem list section below in epic for further details.  Past Medical History: No past medical history on file. Past Surgical History: No past surgical history on file. Social History: Social History   Socioeconomic History   Marital status: Married    Spouse name: Not on file   Number of children: Not on file   Years of education: Not on file   Highest education level: Not on file  Occupational History   Not on file  Tobacco Use   Smoking status: Never   Smokeless tobacco: Never  Substance and Sexual Activity   Alcohol use: Yes   Drug use: Not on file   Sexual activity: Not on file  Other Topics Concern   Not on file  Social History Narrative   Not on file   Social Drivers of Health   Financial Resource Strain: Not on file  Food Insecurity: No Food Insecurity (06/16/2022)   Hunger Vital Sign    Worried About Running Out of Food in the Last Year: Never true    Ran Out of Food in the Last Year: Never true  Transportation Needs: No Transportation Needs (06/16/2022)   PRAPARE - Administrator, Civil Service (Medical): No    Lack of Transportation (Non-Medical): No  Physical Activity: Not on file  Stress: Not on file  Social Connections: Unknown (01/01/2022)   Received from [facility], [facility]   Social Network    Social Network: Not on file   Family History: Family History  Problem Relation Age of Onset   Diabetes Mother    Hypertension Mother    Heart attack Father    Hypertension Father    Alcohol abuse Paternal Aunt    Cancer Maternal Grandmother    Allergies: Allergies  Allergen Reactions   Oxycodone Other (See Comments)    Tremors, hallucinations   Medications: See med rec.  Review  of Systems: No headache, visual changes, nausea, vomiting, diarrhea, constipation, dizziness, abdominal pain, skin rash, fevers, chills, night sweats, swollen lymph nodes, weight loss, chest pain, body aches, joint swelling, muscle aches, shortness of breath, mood changes, visual or auditory hallucinations.  Objective:    General: Well Developed, well nourished, and in no acute distress.  Neuro: Alert and oriented x3, extra-ocular muscles intact, sensation grossly intact. Cranial nerves II through XII are intact, motor, sensory, and coordinative functions are all intact. HEENT: Normocephalic, atraumatic, pupils equal round reactive to light, neck supple, no masses, no lymphadenopathy, thyroid  nonpalpable. Oropharynx, nasopharynx, external ear canals are unremarkable. Skin: Warm and dry, no rashes noted.  Cardiac: Regular rate and rhythm, no murmurs rubs or gallops.  Respiratory: Clear to auscultation bilaterally. Not using accessory muscles, speaking in full sentences.  Abdominal: Soft, nontender, nondistended, positive bowel sounds, no masses, no organomegaly.  Musculoskeletal: Shoulder, elbow, wrist, hip, knee, ankle stable, and with full range of motion.  There is reproduction of his ankle pain with resisted plantarflexion of the left great toe.  Impression and Recommendations:    The patient was counselled, risk factors were discussed, anticipatory guidance given.  Annual physical exam Fasting annual physical, routine labs, flu and Tdap today. Declines COVID. Return to see me in 1 year.  Mixed hyperlipidemia Elevated last year, rechecking,  if still elevated we will do a low-dose Crestor .   Lipids are quite elevated, we will add low-dose Crestor , recheck fasting lipids in 2 to 3 months.  Ankle pain, left Pain left ankle, he localizes the pain somewhat behind the lateral malleolus and behind the joint itself, exam is benign with the exception of some reproduction of pain with resisted  eversion and resisted plantarflexion of the great toe, the most provocative maneuver is resisted plantarflexion of the great toe suggesting flexor hallucis longus tenosynovitis per Adding meloxicam , x-rays, home PT, return to see me in 6 weeks, MRI if no better.   ____________________________________________ Debby PARAS. Curtis, M.D., ABFM., CAQSM., AME. Primary Care and Sports Medicine [facility]  Adjunct Professor of [facility] Medicine  [employer]  Restaurant Manager, [employer]

## 2023-08-23 NOTE — Assessment & Plan Note (Signed)
 Pain left ankle, he localizes the pain somewhat behind the lateral malleolus and behind the joint itself, exam is benign with the exception of some reproduction of pain with resisted eversion and resisted plantarflexion of the great toe, the most provocative maneuver is resisted plantarflexion of the great toe suggesting flexor hallucis longus tenosynovitis per Adding meloxicam , x-rays, home PT, return to see me in 6 weeks, MRI if no better.

## 2023-08-23 NOTE — Assessment & Plan Note (Signed)
Fasting annual physical, routine labs, flu and Tdap today. Declines COVID. Return to see me in 1 year.

## 2023-08-23 NOTE — Patient Instructions (Signed)

## 2023-08-24 LAB — HEMOGLOBIN A1C
Est. average glucose Bld gHb Est-mCnc: 111 mg/dL
Hgb A1c MFr Bld: 5.5 % (ref 4.8–5.6)

## 2023-08-24 LAB — COMPREHENSIVE METABOLIC PANEL
ALT: 52 [IU]/L — ABNORMAL HIGH (ref 0–44)
AST: 30 [IU]/L (ref 0–40)
Albumin: 4.5 g/dL (ref 3.8–4.9)
Alkaline Phosphatase: 65 [IU]/L (ref 44–121)
BUN/Creatinine Ratio: 11 (ref 9–20)
BUN: 11 mg/dL (ref 6–24)
Bilirubin Total: 0.4 mg/dL (ref 0.0–1.2)
CO2: 23 mmol/L (ref 20–29)
Calcium: 9.7 mg/dL (ref 8.7–10.2)
Chloride: 101 mmol/L (ref 96–106)
Creatinine, Ser: 0.98 mg/dL (ref 0.76–1.27)
Globulin, Total: 3 g/dL (ref 1.5–4.5)
Glucose: 100 mg/dL — ABNORMAL HIGH (ref 70–99)
Potassium: 4.7 mmol/L (ref 3.5–5.2)
Sodium: 139 mmol/L (ref 134–144)
Total Protein: 7.5 g/dL (ref 6.0–8.5)
eGFR: 90 mL/min/{1.73_m2} (ref >59)

## 2023-08-24 LAB — PSA, TOTAL AND FREE
PSA, Free Pct: 31 %
PSA, Free: 0.62 ng/mL
Prostate Specific Ag, Serum: 2 ng/mL (ref 0.0–4.0)

## 2023-08-24 LAB — CBC
Hematocrit: 48.4 % (ref 37.5–51.0)
Hemoglobin: 15.9 g/dL (ref 13.0–17.7)
MCH: 31 pg (ref 26.6–33.0)
MCHC: 32.9 g/dL (ref 31.5–35.7)
MCV: 94 fL (ref 79–97)
Platelets: 300 10*3/uL (ref 150–450)
RBC: 5.13 x10E6/uL (ref 4.14–5.80)
RDW: 13.6 % (ref 11.6–15.4)
WBC: 7.1 10*3/uL (ref 3.4–10.8)

## 2023-08-24 LAB — LIPID PANEL
Chol/HDL Ratio: 4.9 {ratio} (ref 0.0–5.0)
Cholesterol, Total: 222 mg/dL — ABNORMAL HIGH (ref 100–199)
HDL: 45 mg/dL (ref >39)
LDL Chol Calc (NIH): 149 mg/dL — ABNORMAL HIGH (ref 0–99)
Triglycerides: 156 mg/dL — ABNORMAL HIGH (ref 0–149)
VLDL Cholesterol Cal: 28 mg/dL (ref 5–40)

## 2023-08-24 LAB — TSH: TSH: 1.3 u[IU]/mL (ref 0.450–4.500)

## 2023-08-25 MED ORDER — ROSUVASTATIN CALCIUM 10 MG PO TABS: 10.0000 mg | ORAL_TABLET | Freq: Every day | ORAL | 3 refills | Status: AC

## 2023-08-25 NOTE — Addendum Note (Signed)
 Addended by: Monica Becton on: 08/25/2023 09:04 AM   Modules accepted: Orders

## 2023-10-03 ENCOUNTER — Ambulatory Visit (INDEPENDENT_AMBULATORY_CARE_PROVIDER_SITE_OTHER): Payer: BC Managed Care – PPO | Admitting: Sports Medicine

## 2023-10-03 ENCOUNTER — Encounter: Payer: Self-pay | Admitting: Sports Medicine

## 2023-10-03 VITALS — BP 136/83 | HR 84 | Ht 69.0 in | Wt 221.0 lb

## 2023-10-03 DIAGNOSIS — M25572 Pain in left ankle and joints of left foot: Secondary | ICD-10-CM

## 2023-10-03 DIAGNOSIS — L821 Other seborrheic keratosis: Secondary | ICD-10-CM | POA: Diagnosis not present

## 2023-10-03 DIAGNOSIS — G8929 Other chronic pain: Secondary | ICD-10-CM | POA: Diagnosis not present

## 2023-10-03 NOTE — Progress Notes (Signed)
    Procedures performed today:    Procedure:  Cryodestruction of right forearm seborrheic keratosis Consent obtained and verified. Time-out conducted. Noted no overlying erythema, induration, or other signs of local infection. Completed without difficulty using Cryo-Gun. Advised to call if fevers/chills, erythema, induration, drainage, or persistent bleeding.  Independent interpretation of notes and tests performed by another provider:   None.  Brief History, Exam, Impression, and Recommendations:    Ankle pain, left Jared Singleton returns, he has chronic ankle pain behind the lateral malleolus, and behind the joint itself, at the last visit the most provocative exam maneuver was resisted plantarflexion of the great toe suggesting flexor hallucis longus tenosynovitis. He did really well with meloxicam  X-rays unrevealing, he did his home PT. He improved considerably but when he stopped meloxicam  some of the pain came back, it is relatively mild and he feels like he can live with it, he will continue conservative treatment and watchful waiting, if persistence or intractable pain over the next 6 weeks to 2 months I am happy to order the MRI.  Seborrheic keratosis right dorsal forearm Cryotherapy, return as needed.    ____________________________________________ Joselyn Nicely. Sandy Crumb, M.D., ABFM., CAQSM., AME. Primary Care and Sports Medicine [facility]  Adjunct Professor of [facility] Medicine  [employer]  Restaurant manager, fast food

## 2023-10-03 NOTE — Assessment & Plan Note (Signed)
 Almir returns, he has chronic ankle pain behind the lateral malleolus, and behind the joint itself, at the last visit the most provocative exam maneuver was resisted plantarflexion of the great toe suggesting flexor hallucis longus tenosynovitis. He did really well with meloxicam  X-rays unrevealing, he did his home PT. He improved considerably but when he stopped meloxicam  some of the pain came back, it is relatively mild and he feels like he can live with it, he will continue conservative treatment and watchful waiting, if persistence or intractable pain over the next 6 weeks to 2 months I am happy to order the MRI.

## 2023-10-03 NOTE — Assessment & Plan Note (Signed)
 Cryotherapy, return as needed.

## 2024-03-29 ENCOUNTER — Ambulatory Visit: Admitting: Sports Medicine

## 2024-03-29 ENCOUNTER — Ambulatory Visit (INDEPENDENT_AMBULATORY_CARE_PROVIDER_SITE_OTHER)

## 2024-03-29 DIAGNOSIS — M7661 Achilles tendinitis, right leg: Secondary | ICD-10-CM

## 2024-03-29 DIAGNOSIS — M25571 Pain in right ankle and joints of right foot: Secondary | ICD-10-CM | POA: Diagnosis not present

## 2024-03-29 NOTE — Assessment & Plan Note (Signed)
 Weeks of pain right posterior heel worse with weightbearing. Tender to palpation at the Achilles insertion. Negative Thompson's test. Pes cavus. Adding heel lifts, eccentric physical therapy, x-rays, return to see me 6 weeks.

## 2024-03-29 NOTE — Progress Notes (Signed)
    Procedures performed today:    None.  Independent interpretation of notes and tests performed by another provider:   None.  Brief History, Exam, Impression, and Recommendations:    Achilles tendinitis, right leg Weeks of pain right posterior heel worse with weightbearing. Tender to palpation at the Achilles insertion. Negative Thompson's test. Pes cavus. Adding heel lifts, eccentric physical therapy, x-rays, return to see me 6 weeks.    ____________________________________________ Debby PARAS. Curtis, M.D., ABFM., CAQSM., AME. Primary Care and Sports Medicine [facility]  Adjunct Professor of [facility] Medicine  [employer]  Restaurant manager, fast food

## 2024-03-30 ENCOUNTER — Ambulatory Visit: Payer: Self-pay | Admitting: Sports Medicine

## 2024-04-24 ENCOUNTER — Encounter: Payer: Self-pay | Admitting: Sports Medicine

## 2024-05-10 ENCOUNTER — Ambulatory Visit: Admitting: Sports Medicine

## 2024-07-11 ENCOUNTER — Telehealth: Payer: Self-pay

## 2024-07-11 ENCOUNTER — Ambulatory Visit
Admission: EM | Admit: 2024-07-11 | Discharge: 2024-07-11 | Disposition: A | Discharge status: Home / Self Care | Attending: Family Medicine | Admitting: Family Medicine

## 2024-07-11 ENCOUNTER — Telehealth: Payer: Self-pay | Admitting: Family Medicine

## 2024-07-11 ENCOUNTER — Other Ambulatory Visit: Payer: Self-pay

## 2024-07-11 DIAGNOSIS — J3489 Other specified disorders of nose and nasal sinuses: Secondary | ICD-10-CM

## 2024-07-11 DIAGNOSIS — J0100 Acute maxillary sinusitis, unspecified: Secondary | ICD-10-CM

## 2024-07-11 HISTORY — DX: Malignant neoplasm of colon, unspecified: C18.9

## 2024-07-11 HISTORY — DX: Hyperlipidemia, unspecified: E78.5

## 2024-07-11 MED ORDER — PREDNISONE 20 MG PO TABS: ORAL_TABLET | ORAL | 0 refills | Status: DC

## 2024-07-11 MED ORDER — AMOXICILLIN-POT CLAVULANATE 875-125 MG PO TABS: 1.0000 | ORAL_TABLET | Freq: Two times a day (BID) | ORAL | 0 refills | Status: DC

## 2024-07-11 NOTE — Telephone Encounter (Signed)
 Patient called requesting medications be sent to pharmacy.  [facility] had national outage in which pharmacy prescriptions were hung up for several hours.  Patient notified.

## 2024-07-11 NOTE — ED Triage Notes (Signed)
 Sick x 3 weeks, has current c/o nasal congestion, heavy phlegm coughing up. Initially was achy and heavy in chest. No fever. Has had flonase .

## 2024-07-11 NOTE — ED Provider Notes (Signed)
 [name]    CSN: [id number] Arrival date & time: 07/11/24  1313      History   Chief Complaint Chief Complaint  Patient presents with   Nasal Congestion    HPI Marwan Lipe is a 58 y.o. male.   HPI 58 year old male presents with nasal congestion, productive cough with heavy phlegm for 3 weeks.  PMH significant for malignant neoplasm of transverse colon, mixed HLD, and right cervical radiculopathy.   Past Medical History:  Diagnosis Date   Colon cancer [facility])    Hyperlipidemia     Patient Active Problem List   Diagnosis Date Noted   Achilles tendinitis, right leg 03/29/2024   Seborrheic keratosis right dorsal forearm 10/03/2023   Mixed hyperlipidemia 08/23/2023   Ankle pain, left 08/23/2023   Transaminitis 09/28/2022   Primary osteoarthritis, right elbow 09/27/2022   Elevated blood pressure reading 09/27/2022   Systolic murmur 08/03/2018   Malignant neoplasm of transverse colon (HCC) 06/27/2017   Right cervical radiculopathy 08/02/2016   Eczema of right external ear 07/31/2015   Vasovagal attack 12/11/2013   Annual physical exam 06/22/2013   Allergic asthma 06/22/2013    Past Surgical History:  Procedure Laterality Date   COLON SURGERY     SMALL INTESTINE SURGERY         Home Medications    Prior to Admission medications   Medication Sig Start Date End Date Taking? Authorizing Provider  amoxicillin-clavulanate (AUGMENTIN) 875-125 MG tablet Take 1 tablet by mouth 2 (two) times daily for 10 days. 07/11/24 07/21/24 Yes Teddy Sharper, FNP  predniSONE (DELTASONE) 20 MG tablet Take 3 tabs PO daily x 5 days. 07/11/24  Yes Teddy Sharper, FNP  albuterol  (VENTOLIN  HFA) 108 (90 Base) MCG/ACT inhaler TAKE 2 PUFFS BY MOUTH EVERY 6 HOURS AS NEEDED FOR WHEEZE OR SHORTNESS OF BREATH 10/26/22   Curtis Debby PARAS, MD  fluticasone  (FLOVENT  HFA) 110 MCG/ACT inhaler TAKE 2 PUFFS BY MOUTH TWICE A DAY 02/15/23   Curtis Debby PARAS, MD  hyoscyamine   (LEVBID ) 0.375 MG 12 hr tablet Take 1 tablet (0.375 mg total) by mouth 2 (two) times daily. 09/03/22   Curtis Debby PARAS, MD  rosuvastatin  (CRESTOR ) 10 MG tablet Take 1 tablet (10 mg total) by mouth daily. 08/25/23   Curtis Debby PARAS, MD  VITAMIN D  PO Take by mouth.    [provider]    Family History Family History  Problem Relation Age of Onset   Diabetes Mother    Hypertension Mother    Heart attack Father    Hypertension Father    Alcohol abuse Paternal Aunt    Cancer Maternal Grandmother     Social History Social History   Tobacco Use   Smoking status: Never   Smokeless tobacco: Never  Substance Use Topics   Alcohol use: Yes   Drug use: Never     Allergies   Oxycodone   Review of Systems Review of Systems  HENT:  Positive for congestion, sinus pressure and sinus pain.   Respiratory:  Positive for cough.   All other systems reviewed and are negative.    Physical Exam Triage Vital Signs ED Triage Vitals  Encounter Vitals Group     BP 07/11/24 1334 (!) 147/92     Girls Systolic BP Percentile --      Girls Diastolic BP Percentile --      Boys Systolic BP Percentile --      Boys Diastolic BP Percentile --      Pulse  Rate 07/11/24 1334 88     Resp 07/11/24 1334 16     Temp 07/11/24 1334 97.6 F (36.4 C)     Temp src --      SpO2 07/11/24 1334 95 %     Weight --      Height --      Head Circumference --      Peak Flow --      Pain Score 07/11/24 1337 0     Pain Loc --      Pain Education --      Exclude from Growth Chart --    No data found.  Updated Vital Signs BP (!) 147/92   Pulse 88   Temp 97.6 F (36.4 C)   Resp 16   SpO2 95%   Visual Acuity Right Eye Distance:   Left Eye Distance:   Bilateral Distance:    Right Eye Near:   Left Eye Near:    Bilateral Near:     Physical Exam Vitals and nursing note reviewed.  Constitutional:      General: He is not in acute distress.    Appearance: Normal appearance. He is  normal weight. He is ill-appearing. He is not toxic-appearing or diaphoretic.  HENT:     Head: Normocephalic and atraumatic.     Right Ear: Tympanic membrane normal.     Left Ear: Tympanic membrane and external ear normal.     Ears:     Comments: Significant eustachian tube dysfunction noted bilaterally    Nose:     Right Sinus: Maxillary sinus tenderness present.     Left Sinus: Maxillary sinus tenderness present.     Comments: Turbinates are erythematous/edematous    Mouth/Throat:     Mouth: Mucous membranes are moist.     Pharynx: Oropharynx is clear.  Eyes:     Extraocular Movements: Extraocular movements intact.     Conjunctiva/sclera: Conjunctivae normal.     Pupils: Pupils are equal, round, and reactive to light.  Cardiovascular:     Rate and Rhythm: Normal rate and regular rhythm.     Heart sounds: Normal heart sounds.  Pulmonary:     Effort: Pulmonary effort is normal.     Breath sounds: Normal breath sounds. No wheezing, rhonchi or rales.  Musculoskeletal:        General: Normal range of motion.  Skin:    General: Skin is warm and dry.  Neurological:     General: No focal deficit present.     Mental Status: He is alert and oriented to person, place, and time. Mental status is at baseline.      UC Treatments / Results  Labs (all labs ordered are listed, but only abnormal results are displayed) Labs Reviewed - No data to display  EKG   Radiology No results found.  Procedures Procedures (including critical care time)  Medications Ordered in UC Medications - No data to display  Initial Impression / Assessment and Plan / UC Course  I have reviewed the triage vital signs and the nursing notes.  Pertinent labs & imaging results that were available during my care of the patient were reviewed by me and considered in my medical decision making (see chart for details).     MDM: 1.  Acute maxillary sinusitis, recurrence not specified-Rx'd Augmentin 875/125 mg  tablet: Take 1 tablet twice daily x 10 days; 2.  Sinus pressure-Rx prednisone 20 mg tablet: Take 3 tablets p.o. daily x 5 days. Patient take  medication as directed with food to completion.  Advised take prednisone with first dose of Augmentin for the next 5 of 10 days.  Encouraged to increase daily water intake to 64 ounces per day while taking these medications.  Advised if symptoms worsen and/or unresolved please follow-up with your PCP or here for further evaluation.  Patient discharged home, hemodynamically stable. Final Clinical Impressions(s) / UC Diagnoses   Final diagnoses:  Acute maxillary sinusitis, recurrence not specified  Sinus pressure     Discharge Instructions      Patient take medication as directed with food to completion.  Advised take prednisone with first dose of Augmentin for the next 5 of 10 days.  Encouraged to increase daily water intake to 64 ounces per day while taking these medications.  Advised if symptoms worsen and/or unresolved please follow-up with your PCP or here for further evaluation.     ED Prescriptions     Medication Sig Dispense Auth. Provider   amoxicillin-clavulanate (AUGMENTIN) 875-125 MG tablet Take 1 tablet by mouth 2 (two) times daily for 10 days. 20 tablet Reeder Brisby, FNP   predniSONE (DELTASONE) 20 MG tablet Take 3 tabs PO daily x 5 days. 15 tablet Mitsy Owen, FNP      PDMP not reviewed this encounter.   Teddy Sharper, FNP 07/11/24 1413

## 2024-07-11 NOTE — Telephone Encounter (Signed)
 Called in rx for patient to pharmacy. Per pharmacy, system went down for 30-45 mins after lunch and this was the issue more than likely.

## 2024-07-11 NOTE — Discharge Instructions (Addendum)
 Patient take medication as directed with food to completion.  Advised take prednisone with first dose of Augmentin for the next 5 of 10 days.  Encouraged to increase daily water intake to 64 ounces per day while taking these medications.  Advised if symptoms worsen and/or unresolved please follow-up with your PCP or here for further evaluation.

## 2024-07-12 ENCOUNTER — Telehealth: Payer: Self-pay

## 2024-07-12 NOTE — Telephone Encounter (Signed)
 [facility] if any questions or concerns about UC visit from yesterday.

## 2024-07-20 ENCOUNTER — Other Ambulatory Visit: Payer: Self-pay

## 2024-07-20 ENCOUNTER — Ambulatory Visit
Admission: EM | Admit: 2024-07-20 | Discharge: 2024-07-20 | Disposition: A | Discharge status: Home / Self Care | Attending: Internal Medicine | Admitting: Internal Medicine

## 2024-07-20 DIAGNOSIS — J0100 Acute maxillary sinusitis, unspecified: Secondary | ICD-10-CM | POA: Diagnosis not present

## 2024-07-20 MED ORDER — DOXYCYCLINE HYCLATE 100 MG PO CAPS: 100.0000 mg | ORAL_CAPSULE | Freq: Two times a day (BID) | ORAL | 0 refills | Status: DC

## 2024-07-20 NOTE — ED Triage Notes (Addendum)
 Worsening nasal congestion. Seen for same on 11/19. Has completed antibiotic. Seemed to get better with prednisone  but came back after prednisone  completed.

## 2024-07-20 NOTE — Discharge Instructions (Signed)
 I have prescribed you a second antibiotic for sinus infection.  If symptoms persist or worsen, please follow-up.

## 2024-07-20 NOTE — ED Provider Notes (Signed)
 [name]    CSN: [id number] Arrival date & time: 07/20/24  1525      History   Chief Complaint No chief complaint on file.   HPI Cordon Gassett is a 58 y.o. male.   Patient presents with persistent sinus pressure that has been present for several weeks.  He was seen on 07/11/2024 and prescribed Augmentin  and prednisone  which provided minimal improvement.  He originally had cough and nasal congestion which has resolved, and he is left with sinus pressure.  Denies fever.     Past Medical History:  Diagnosis Date   Colon cancer [facility])    Hyperlipidemia     Patient Active Problem List   Diagnosis Date Noted   Achilles tendinitis, right leg 03/29/2024   Seborrheic keratosis right dorsal forearm 10/03/2023   Mixed hyperlipidemia 08/23/2023   Ankle pain, left 08/23/2023   Transaminitis 09/28/2022   Primary osteoarthritis, right elbow 09/27/2022   Elevated blood pressure reading 09/27/2022   Systolic murmur 08/03/2018   Malignant neoplasm of transverse colon (HCC) 06/27/2017   Right cervical radiculopathy 08/02/2016   Eczema of right external ear 07/31/2015   Vasovagal attack 12/11/2013   Annual physical exam 06/22/2013   Allergic asthma 06/22/2013    Past Surgical History:  Procedure Laterality Date   COLON SURGERY     SMALL INTESTINE SURGERY         Home Medications    Prior to Admission medications   Medication Sig Start Date End Date Taking? Authorizing Provider  doxycycline (VIBRAMYCIN) 100 MG capsule Take 1 capsule (100 mg total) by mouth 2 (two) times daily for 7 days. 07/20/24 07/27/24 Yes Aquarius Tremper, Darryle E, FNP  albuterol  (VENTOLIN  HFA) 108 (90 Base) MCG/ACT inhaler TAKE 2 PUFFS BY MOUTH EVERY 6 HOURS AS NEEDED FOR WHEEZE OR SHORTNESS OF BREATH 10/26/22   Curtis Debby PARAS, MD  fluticasone  (FLOVENT  HFA) 110 MCG/ACT inhaler TAKE 2 PUFFS BY MOUTH TWICE A DAY 02/15/23   Curtis Debby PARAS, MD  hyoscyamine  (LEVBID ) 0.375 MG 12 hr tablet Take  1 tablet (0.375 mg total) by mouth 2 (two) times daily. 09/03/22   Curtis Debby PARAS, MD  rosuvastatin  (CRESTOR ) 10 MG tablet Take 1 tablet (10 mg total) by mouth daily. 08/25/23   Curtis Debby PARAS, MD  VITAMIN D  PO Take by mouth.    [provider]    Family History Family History  Problem Relation Age of Onset   Diabetes Mother    Hypertension Mother    Heart attack Father    Hypertension Father    Alcohol abuse Paternal Aunt    Cancer Maternal Grandmother     Social History Social History   Tobacco Use   Smoking status: Never   Smokeless tobacco: Never  Substance Use Topics   Alcohol use: Yes   Drug use: Never     Allergies   Oxycodone   Review of Systems Review of Systems Per HPI  Physical Exam Triage Vital Signs ED Triage Vitals  Encounter Vitals Group     BP 07/20/24 1658 (!) 146/92     Girls Systolic BP Percentile --      Girls Diastolic BP Percentile --      Boys Systolic BP Percentile --      Boys Diastolic BP Percentile --      Pulse Rate 07/20/24 1658 76     Resp 07/20/24 1658 16     Temp 07/20/24 1658 98.1 F (36.7 C)     Temp  src --      SpO2 07/20/24 1658 97 %     Weight --      Height --      Head Circumference --      Peak Flow --      Pain Score 07/20/24 1700 2     Pain Loc --      Pain Education --      Exclude from Growth Chart --    No data found.  Updated Vital Signs BP (!) 146/92   Pulse 76   Temp 98.1 F (36.7 C)   Resp 16   SpO2 97%   Visual Acuity Right Eye Distance:   Left Eye Distance:   Bilateral Distance:    Right Eye Near:   Left Eye Near:    Bilateral Near:     Physical Exam Constitutional:      General: He is not in acute distress.    Appearance: Normal appearance. He is not toxic-appearing or diaphoretic.  HENT:     Head: Normocephalic and atraumatic.     Right Ear: Tympanic membrane and ear canal normal.     Left Ear: Tympanic membrane and ear canal normal.     Nose: Congestion  present.     Right Sinus: Maxillary sinus tenderness present. No frontal sinus tenderness.     Left Sinus: Maxillary sinus tenderness present. No frontal sinus tenderness.     Mouth/Throat:     Mouth: Mucous membranes are moist.     Pharynx: No posterior oropharyngeal erythema.  Eyes:     Extraocular Movements: Extraocular movements intact.     Conjunctiva/sclera: Conjunctivae normal.     Pupils: Pupils are equal, round, and reactive to light.  Cardiovascular:     Rate and Rhythm: Normal rate and regular rhythm.     Pulses: Normal pulses.     Heart sounds: Normal heart sounds.  Pulmonary:     Effort: Pulmonary effort is normal. No respiratory distress.     Breath sounds: Normal breath sounds. No stridor. No wheezing, rhonchi or rales.  Musculoskeletal:        General: Normal range of motion.     Cervical back: Normal range of motion.  Skin:    General: Skin is warm and dry.  Neurological:     General: No focal deficit present.     Mental Status: He is alert and oriented to person, place, and time. Mental status is at baseline.  Psychiatric:        Mood and Affect: Mood normal.        Behavior: Behavior normal.      UC Treatments / Results  Labs (all labs ordered are listed, but only abnormal results are displayed) Labs Reviewed - No data to display  EKG   Radiology No results found.  Procedures Procedures (including critical care time)  Medications Ordered in UC Medications - No data to display  Initial Impression / Assessment and Plan / UC Course  I have reviewed the triage vital signs and the nursing notes.  Pertinent labs & imaging results that were available during my care of the patient were reviewed by me and considered in my medical decision making (see chart for details).     Patient has persistent sinus infection.  Will treat with doxycycline given Augmentin  was not very helpful.  Will defer additional steroids at this time.  Advised patient to  follow-up if symptoms persist or worsen.  Patient verbalized understanding and was agreeable with  plan. Final Clinical Impressions(s) / UC Diagnoses   Final diagnoses:  Acute maxillary sinusitis, recurrence not specified     Discharge Instructions      I have prescribed you a second antibiotic for sinus infection.  If symptoms persist or worsen, please follow-up.    ED Prescriptions     Medication Sig Dispense Auth. Provider   doxycycline (VIBRAMYCIN) 100 MG capsule Take 1 capsule (100 mg total) by mouth 2 (two) times daily for 7 days. 14 capsule [place], Mallie Giambra E, [place]      PDMP not reviewed this encounter.   Hazen Darryle BRAVO, [place] 07/20/24 [phone]

## 2024-07-27 ENCOUNTER — Other Ambulatory Visit: Payer: Self-pay

## 2024-07-27 ENCOUNTER — Ambulatory Visit
Admission: EM | Admit: 2024-07-27 | Discharge: 2024-07-27 | Disposition: A | Discharge status: Home / Self Care | Attending: Internal Medicine | Admitting: Internal Medicine

## 2024-07-27 DIAGNOSIS — R0981 Nasal congestion: Secondary | ICD-10-CM | POA: Diagnosis not present

## 2024-07-27 DIAGNOSIS — J3489 Other specified disorders of nose and nasal sinuses: Secondary | ICD-10-CM | POA: Diagnosis not present

## 2024-07-27 MED ORDER — PREDNISONE 10 MG (21) PO TBPK: ORAL_TABLET | Freq: Every day | ORAL | 0 refills | Status: DC

## 2024-07-27 NOTE — Discharge Instructions (Addendum)
 I have prescribed you prednisone  steroid taper to help alleviate sinus pressure.  Will defer additional antibiotics at this time.  Ambulatory referral to ENT has been placed.  They should call you to schedule the appointment but if they do not, please call them yourself at provided phone number.

## 2024-07-27 NOTE — ED Triage Notes (Signed)
 Pt presenting with c/o nasal congestion and productive cough with thick yellow sputum. Pt denies any other symptoms. Pt stated that he completed prescribed ABT which was ineffective. Pt stated that he used Flonase  this AM which was effective for his nasal congestion.

## 2024-07-27 NOTE — ED Provider Notes (Addendum)
 [facility]    CSN: [id number] Arrival date & time: 07/27/24  1507      History   Chief Complaint Chief Complaint  Patient presents with   Nasal Congestion    HPI Jared Singleton is a 58 y.o. male.   Patient presents for reevaluation for persistent nasal congestion and sinus pressure.  He also reports a productive cough with clear to yellow sputum.  The symptoms have been present for multiple weeks.  He was originally seen on 11/19 and prescribed Augmentin  and 5-day course of prednisone .  He did not have any improvement with that initial treatment so he returned on 11/28 and was prescribed doxycycline .  He reports no improvement with doxycycline  as well.  He states that he had similar symptoms approximately 1 year ago but has never seen ENT specialty.  He states that he does have seasonal allergies.  Patient states that symptoms started after he was working with leaves in the yard so he is not sure if this is related.  His wife has had similar symptoms as well.  Denies history of asthma or COPD.  Denies fever.     Past Medical History:  Diagnosis Date   Colon cancer [facility])    Hyperlipidemia     Patient Active Problem List   Diagnosis Date Noted   Achilles tendinitis, right leg 03/29/2024   Seborrheic keratosis right dorsal forearm 10/03/2023   Mixed hyperlipidemia 08/23/2023   Ankle pain, left 08/23/2023   Transaminitis 09/28/2022   Primary osteoarthritis, right elbow 09/27/2022   Elevated blood pressure reading 09/27/2022   Systolic murmur 08/03/2018   Malignant neoplasm of transverse colon (HCC) 06/27/2017   Right cervical radiculopathy 08/02/2016   Eczema of right external ear 07/31/2015   Vasovagal attack 12/11/2013   Annual physical exam 06/22/2013   Allergic asthma 06/22/2013    Past Surgical History:  Procedure Laterality Date   COLON SURGERY     SMALL INTESTINE SURGERY         Home Medications    Prior to Admission medications   Medication  Sig Start Date End Date Taking? Authorizing Provider  predniSONE  (STERAPRED UNI-PAK 21 TAB) 10 MG (21) TBPK tablet Take by mouth daily. Take 6 tabs by mouth daily  for 2 days, then 5 tabs for 2 days, then 4 tabs for 2 days, then 3 tabs for 2 days, 2 tabs for 2 days, then 1 tab by mouth daily for 2 days 07/27/24  Yes Alburtis, [place] E, FNP  albuterol  (VENTOLIN  HFA) 108 (90 Base) MCG/ACT inhaler TAKE 2 PUFFS BY MOUTH EVERY 6 HOURS AS NEEDED FOR WHEEZE OR SHORTNESS OF BREATH 10/26/22   Curtis Debby PARAS, MD  fluticasone  (FLOVENT  HFA) 110 MCG/ACT inhaler TAKE 2 PUFFS BY MOUTH TWICE A DAY 02/15/23   Curtis Debby PARAS, MD  hyoscyamine  (LEVBID ) 0.375 MG 12 hr tablet Take 1 tablet (0.375 mg total) by mouth 2 (two) times daily. 09/03/22   Curtis Debby PARAS, MD  rosuvastatin  (CRESTOR ) 10 MG tablet Take 1 tablet (10 mg total) by mouth daily. 08/25/23   Curtis Debby PARAS, MD  VITAMIN D  PO Take by mouth.    [provider]    Family History Family History  Problem Relation Age of Onset   Diabetes Mother    Hypertension Mother    Heart attack Father    Hypertension Father    Cancer Maternal Grandmother    Alcohol abuse Paternal Aunt     Social History Social History  Tobacco Use   Smoking status: Never   Smokeless tobacco: Never  Substance Use Topics   Alcohol use: Yes   Drug use: Never     Allergies   Oxycodone   Review of Systems Review of Systems Per HPI  Physical Exam Triage Vital Signs ED Triage Vitals  Encounter Vitals Group     BP 07/27/24 1525 138/89     Girls Systolic BP Percentile --      Girls Diastolic BP Percentile --      Boys Systolic BP Percentile --      Boys Diastolic BP Percentile --      Pulse Rate 07/27/24 1525 88     Resp 07/27/24 1525 18     Temp 07/27/24 1525 97.9 F (36.6 C)     Temp Source 07/27/24 1525 Oral     SpO2 07/27/24 1525 94 %     Weight 07/27/24 1527 200 lb (90.7 kg)     Height 07/27/24 1527 5' 9 (1.753 m)     Head  Circumference --      Peak Flow --      Pain Score 07/27/24 1527 0     Pain Loc --      Pain Education --      Exclude from Growth Chart --    No data found.  Updated Vital Signs BP 138/89 (BP Location: Right Arm)   Pulse 88   Temp 97.9 F (36.6 C) (Oral)   Resp 18   Ht 5' 9 (1.753 m)   Wt 200 lb (90.7 kg)   SpO2 94%   BMI 29.53 kg/m   Visual Acuity Right Eye Distance:   Left Eye Distance:   Bilateral Distance:    Right Eye Near:   Left Eye Near:    Bilateral Near:     Physical Exam Constitutional:      General: He is not in acute distress.    Appearance: Normal appearance. He is not toxic-appearing or diaphoretic.  HENT:     Head: Normocephalic and atraumatic.     Right Ear: Ear canal normal. A middle ear effusion is present. Tympanic membrane is not perforated, erythematous or bulging.     Left Ear: Ear canal normal. A middle ear effusion is present. Tympanic membrane is not perforated, erythematous or bulging.     Nose: Congestion present.     Right Sinus: Maxillary sinus tenderness present. No frontal sinus tenderness.     Left Sinus: Maxillary sinus tenderness present. No frontal sinus tenderness.     Mouth/Throat:     Mouth: Mucous membranes are moist.     Pharynx: No posterior oropharyngeal erythema.  Eyes:     Extraocular Movements: Extraocular movements intact.     Conjunctiva/sclera: Conjunctivae normal.     Pupils: Pupils are equal, round, and reactive to light.  Cardiovascular:     Rate and Rhythm: Normal rate and regular rhythm.     Pulses: Normal pulses.     Heart sounds: Normal heart sounds.  Pulmonary:     Effort: Pulmonary effort is normal. No respiratory distress.     Breath sounds: Normal breath sounds. No stridor. No wheezing, rhonchi or rales.  Musculoskeletal:        General: Normal range of motion.     Cervical back: Normal range of motion.  Skin:    General: Skin is warm and dry.  Neurological:     General: No focal deficit  present.     Mental Status:  He is alert and oriented to person, place, and time. Mental status is at baseline.  Psychiatric:        Mood and Affect: Mood normal.        Behavior: Behavior normal.      UC Treatments / Results  Labs (all labs ordered are listed, but only abnormal results are displayed) Labs Reviewed - No data to display  EKG   Radiology No results found.  Procedures Procedures (including critical care time)  Medications Ordered in UC Medications - No data to display  Initial Impression / Assessment and Plan / UC Course  I have reviewed the triage vital signs and the nursing notes.  Pertinent labs & imaging results that were available during my care of the patient were reviewed by me and considered in my medical decision making (see chart for details).     Patient presenting with persistent sinus pressure and nasal congestion for multiple weeks after being treated with 1 course of steroids and 2 courses of antibiotics.  Offered patient chest imaging given patient was reporting persistent cough but he declined this.  Will defer additional antibiotics at this time.  He reports that prednisone  provided most improvement at original visit so will prescribe prednisone  steroid taper.  Ambulatory referral to ENT was placed as well.  Advised patient that if they do not call him in the next few days, he is to call them himself at provided contact information.  Advised strict follow-up precautions.  Oxygen sustaining at 95 percent during physical exam. Patient verbalized understanding and was agreeable with plan. Final Clinical Impressions(s) / UC Diagnoses   Final diagnoses:  Sinus pressure  Nasal congestion     Discharge Instructions      I have prescribed you prednisone  steroid taper to help alleviate sinus pressure.  Will defer additional antibiotics at this time.  Ambulatory referral to ENT has been placed.  They should call you to schedule the appointment but if  they do not, please call them yourself at provided phone number.    ED Prescriptions     Medication Sig Dispense Auth. Provider   predniSONE  (STERAPRED UNI-PAK 21 TAB) 10 MG (21) TBPK tablet Take by mouth daily. Take 6 tabs by mouth daily  for 2 days, then 5 tabs for 2 days, then 4 tabs for 2 days, then 3 tabs for 2 days, 2 tabs for 2 days, then 1 tab by mouth daily for 2 days 42 tablet [place], Darryle BRAVO, [place]      PDMP not reviewed this encounter.   Hazen Darryle BRAVO, [place] 07/27/24 1600    Hazen Darryle BRAVO, [place] 07/27/24 [phone]

## 2024-09-13 ENCOUNTER — Ambulatory Visit
Admission: EM | Admit: 2024-09-13 | Discharge: 2024-09-13 | Disposition: A | Discharge status: Home / Self Care | Attending: Emergency Medicine | Admitting: Emergency Medicine

## 2024-09-13 DIAGNOSIS — R030 Elevated blood-pressure reading, without diagnosis of hypertension: Secondary | ICD-10-CM | POA: Diagnosis not present

## 2024-09-13 DIAGNOSIS — R0981 Nasal congestion: Secondary | ICD-10-CM

## 2024-09-13 DIAGNOSIS — B9789 Other viral agents as the cause of diseases classified elsewhere: Secondary | ICD-10-CM | POA: Diagnosis not present

## 2024-09-13 DIAGNOSIS — J329 Chronic sinusitis, unspecified: Secondary | ICD-10-CM

## 2024-09-13 DIAGNOSIS — J3489 Other specified disorders of nose and nasal sinuses: Secondary | ICD-10-CM | POA: Diagnosis not present

## 2024-09-13 MED ORDER — PREDNISONE 10 MG (21) PO TBPK: ORAL_TABLET | Freq: Every day | ORAL | 0 refills | Status: DC

## 2024-09-13 NOTE — ED Provider Notes (Signed)
 " [name]    CSN: [id number] Arrival date & time: 09/13/24  1116      History   Chief Complaint Chief Complaint  Patient presents with   Nasal Congestion    HPI Jared Singleton is a 59 y.o. male.   Patient presents with sinus pressure and nasal congestion that began on 1/18.  Patient denies any fever, cough, sore throat, chest pain, shortness of breath, nausea, vomiting, diarrhea, and abdominal pain.  Patient reports he has been using Flonase  with minimal relief.  Patient reports that he has been dealing with on and off issues with his sinuses and sinusitis.  Patient reports that in the past he has gone through multiple rounds of antibiotics without relief and the only thing that seems to help the most is a tapered Dosepak of prednisone .  Patient's blood pressure was elevated in clinic today.  Recheck was lower but still elevated.  Patient reports that he was told about a year and a half ago that his blood pressure was elevated and his primary care was monitoring this, but then his primary care doctor left the practice.  Patient reports that he does have an appointment scheduled this upcoming Monday with a new primary care provider to evaluate this.  The history is provided by the patient and medical records.    Past Medical History:  Diagnosis Date   Colon cancer [facility])    Hyperlipidemia     Patient Active Problem List   Diagnosis Date Noted   Achilles tendinitis, right leg 03/29/2024   Seborrheic keratosis right dorsal forearm 10/03/2023   Mixed hyperlipidemia 08/23/2023   Ankle pain, left 08/23/2023   Transaminitis 09/28/2022   Primary osteoarthritis, right elbow 09/27/2022   Elevated blood pressure reading 09/27/2022   Vitamin D  deficiency 09/22/2019   Systolic murmur 08/03/2018   Incisional hernia, without obstruction or gangrene 03/20/2018   Neuropathy due to chemotherapeutic drug 01/04/2018   Iron deficiency anemia due to chronic blood loss 10/12/2017    Pulmonary nodule 09/13/2017   Perforation bowel (HCC) 08/11/2017   Malignant neoplasm of transverse colon (HCC) 06/27/2017   Right cervical radiculopathy 08/02/2016   Eczema of right external ear 07/31/2015   Vasovagal attack 12/11/2013   Annual physical exam 06/22/2013   Allergic asthma 06/22/2013    Past Surgical History:  Procedure Laterality Date   COLON SURGERY     SMALL INTESTINE SURGERY         Home Medications    Prior to Admission medications  Medication Sig Start Date End Date Taking? Authorizing Provider  predniSONE  (STERAPRED UNI-PAK 21 TAB) 10 MG (21) TBPK tablet Take by mouth daily. Take 6 tabs by mouth daily  for 2 days, then 5 tabs for 2 days, then 4 tabs for 2 days, then 3 tabs for 2 days, 2 tabs for 2 days, then 1 tab by mouth daily for 2 days 09/13/24  Yes Johnie Flaming A, NP  albuterol  (VENTOLIN  HFA) 108 (90 Base) MCG/ACT inhaler TAKE 2 PUFFS BY MOUTH EVERY 6 HOURS AS NEEDED FOR WHEEZE OR SHORTNESS OF BREATH 10/26/22   Curtis Debby PARAS, MD  fluticasone  (FLOVENT  HFA) 110 MCG/ACT inhaler TAKE 2 PUFFS BY MOUTH TWICE A DAY 02/15/23   Curtis Debby PARAS, MD  hyoscyamine  (LEVBID ) 0.375 MG 12 hr tablet Take 1 tablet (0.375 mg total) by mouth 2 (two) times daily. 09/03/22   Curtis Debby PARAS, MD  rosuvastatin  (CRESTOR ) 10 MG tablet Take 1 tablet (10 mg total) by mouth daily.  08/25/23   Curtis Debby PARAS, MD  VITAMIN D  PO Take by mouth.    [provider]    Family History Family History  Problem Relation Age of Onset   Diabetes Mother    Hypertension Mother    Heart attack Father    Hypertension Father    Cancer Maternal Grandmother    Alcohol abuse Paternal Aunt     Social History Social History[1]   Allergies   Oxycodone   Review of Systems Review of Systems  Per HPI  Physical Exam Triage Vital Signs ED Triage Vitals [09/13/24 1148]  Encounter Vitals Group     BP (S) (!) 168/108     Girls Systolic BP Percentile       Girls Diastolic BP Percentile      Boys Systolic BP Percentile      Boys Diastolic BP Percentile      Pulse Rate 81     Resp 16     Temp 97.9 F (36.6 C)     Temp Source Tympanic     SpO2 97 %     Weight      Height      Head Circumference      Peak Flow      Pain Score      Pain Loc      Pain Education      Exclude from Growth Chart    No data found.  Updated Vital Signs BP (!) 152/99 (BP Location: Right Arm)   Pulse 81   Temp 97.9 F (36.6 C) (Tympanic)   Resp 16   SpO2 97%   Visual Acuity Right Eye Distance:   Left Eye Distance:   Bilateral Distance:    Right Eye Near:   Left Eye Near:    Bilateral Near:     Physical Exam Vitals and nursing note reviewed.  Constitutional:      General: He is awake. He is not in acute distress.    Appearance: Normal appearance. He is well-developed and well-groomed. He is not ill-appearing.  HENT:     Right Ear: Tympanic membrane, ear canal and external ear normal.     Left Ear: Ear canal and external ear normal. Tympanic membrane is erythematous.     Nose: Congestion present. No rhinorrhea.     Right Sinus: Maxillary sinus tenderness present.     Left Sinus: Maxillary sinus tenderness present.     Mouth/Throat:     Pharynx: Posterior oropharyngeal erythema and postnasal drip present. No pharyngeal swelling.     Tonsils: No tonsillar exudate.  Neurological:     Mental Status: He is alert.  Psychiatric:        Behavior: Behavior is cooperative.      UC Treatments / Results  Labs (all labs ordered are listed, but only abnormal results are displayed) Labs Reviewed - No data to display  EKG   Radiology No results found.  Procedures Procedures (including critical care time)  Medications Ordered in UC Medications - No data to display  Initial Impression / Assessment and Plan / UC Course  I have reviewed the triage vital signs and the nursing notes.  Pertinent labs & imaging results that were available during  my care of the patient were reviewed by me and considered in my medical decision making (see chart for details).     Patient is overall well-appearing.  Vitals are stable.  Blood pressure is elevated in clinic.  Suspect symptoms likely related to  a viral/chronic sinusitis.  Prescribed prednisone  taper pack as requested due to patient reporting relief with this in the past.  Recommended continuing Flonase  nasal spray.  Discussed over-the-counter medications.  Deferred starting patient on blood pressure medication today as he does have an appointment scheduled with a new primary care provider on this upcoming Monday.  Discussed importance of keeping appointment with primary care provider to evaluate blood pressure and discussion of ENT referral.  Discussed follow-up and return precautions. Final Clinical Impressions(s) / UC Diagnoses   Final diagnoses:  Viral sinusitis  Recurrent sinusitis  Sinus pressure  Nasal congestion     Discharge Instructions      Start taking the prednisone  taper pack as instructed to on the package. You can continue using Flonase  daily to help with this as well. You can also take over-the-counter Mucinex for congestion as well.  Avoid medications such as Sudafed or those that include phenylephrine or pseudoephedrine as this can increase your blood pressure. Keep your appointment with your new primary care provider on Monday for further evaluation of your blood pressure and to discuss possible referral to ENT for recurrent sinusitis. Return here as needed.   ED Prescriptions     Medication Sig Dispense Auth. Provider   predniSONE  (STERAPRED UNI-PAK 21 TAB) 10 MG (21) TBPK tablet Take by mouth daily. Take 6 tabs by mouth daily  for 2 days, then 5 tabs for 2 days, then 4 tabs for 2 days, then 3 tabs for 2 days, 2 tabs for 2 days, then 1 tab by mouth daily for 2 days 42 tablet Johnie Flaming A, NP      PDMP not reviewed this encounter.    [1]  Social  History Tobacco Use   Smoking status: Never   Smokeless tobacco: Never  Substance Use Topics   Alcohol use: Yes   Drug use: Never     Johnie Flaming LABOR, NP 09/13/24 1220  "

## 2024-09-13 NOTE — ED Triage Notes (Signed)
 Patient presents to [facility] for sinus symptoms. States nasal congestion since Sunday. Treating with nasal spray.

## 2024-09-13 NOTE — Discharge Instructions (Signed)
 Start taking the prednisone  taper pack as instructed to on the package. You can continue using Flonase  daily to help with this as well. You can also take over-the-counter Mucinex for congestion as well.  Avoid medications such as Sudafed or those that include phenylephrine or pseudoephedrine as this can increase your blood pressure. Keep your appointment with your new primary care provider on Monday for further evaluation of your blood pressure and to discuss possible referral to ENT for recurrent sinusitis. Return here as needed.

## 2024-09-21 ENCOUNTER — Encounter: Payer: Self-pay | Admitting: Urgent Care

## 2024-09-21 ENCOUNTER — Ambulatory Visit: Admitting: Urgent Care

## 2024-09-21 VITALS — BP 138/86 | HR 67 | Ht 70.0 in | Wt 220.0 lb

## 2024-09-21 DIAGNOSIS — E782 Mixed hyperlipidemia: Secondary | ICD-10-CM | POA: Diagnosis not present

## 2024-09-21 DIAGNOSIS — Z6831 Body mass index (BMI) 31.0-31.9, adult: Secondary | ICD-10-CM | POA: Diagnosis not present

## 2024-09-21 DIAGNOSIS — R7989 Other specified abnormal findings of blood chemistry: Secondary | ICD-10-CM

## 2024-09-21 DIAGNOSIS — R030 Elevated blood-pressure reading, without diagnosis of hypertension: Secondary | ICD-10-CM | POA: Diagnosis not present

## 2024-09-21 DIAGNOSIS — Z125 Encounter for screening for malignant neoplasm of prostate: Secondary | ICD-10-CM

## 2024-09-21 DIAGNOSIS — Z79899 Other long term (current) drug therapy: Secondary | ICD-10-CM | POA: Diagnosis not present

## 2024-09-21 DIAGNOSIS — Z23 Encounter for immunization: Secondary | ICD-10-CM

## 2024-09-21 DIAGNOSIS — Z131 Encounter for screening for diabetes mellitus: Secondary | ICD-10-CM

## 2024-09-21 DIAGNOSIS — Z85038 Personal history of other malignant neoplasm of large intestine: Secondary | ICD-10-CM | POA: Diagnosis not present

## 2024-09-21 DIAGNOSIS — R0981 Nasal congestion: Secondary | ICD-10-CM | POA: Diagnosis not present

## 2024-09-21 NOTE — Patient Instructions (Signed)
Please monitor your blood pressure at home.

## 2024-09-21 NOTE — Progress Notes (Unsigned)
" ° °  Established Patient Office Visit  Subjective:  Patient ID: Jared Singleton, male    DOB: Dec 06, 1965  Age: 59 y.o. MRN: [id number]  Chief Complaint  Patient presents with   Establish Care    HPI  {History (Optional):23778}  ROS: as noted in HPI  Objective:     BP 138/86   Pulse 67   Ht 5' 10 (1.778 m)   Wt 220 lb (99.8 kg)   SpO2 96%   BMI 31.57 kg/m  BP Readings from Last 3 Encounters:  09/21/24 138/86  09/13/24 (!) 152/99  07/27/24 138/89   Wt Readings from Last 3 Encounters:  09/21/24 220 lb (99.8 kg)  07/27/24 200 lb (90.7 kg)  10/03/23 221 lb (100.2 kg)      Physical Exam   No results found for any visits on 09/21/24.  {Labs (Optional):23779}  The 10-year ASCVD risk score (Arnett DK, et al., 2019) is: 10.7%  Assessment & Plan:  Immunization due     No follow-ups on file.   Benton LITTIE Gave, PA  "

## 2024-09-22 ENCOUNTER — Ambulatory Visit: Payer: Self-pay | Admitting: Urgent Care

## 2024-09-22 DIAGNOSIS — Z6831 Body mass index (BMI) 31.0-31.9, adult: Secondary | ICD-10-CM | POA: Insufficient documentation

## 2024-09-22 DIAGNOSIS — Z85038 Personal history of other malignant neoplasm of large intestine: Secondary | ICD-10-CM | POA: Insufficient documentation

## 2024-09-22 LAB — CBC WITH DIFFERENTIAL/PLATELET
Basophils Absolute: 0 10*3/uL (ref 0.0–0.2)
Basos: 0 %
EOS (ABSOLUTE): 0.1 10*3/uL (ref 0.0–0.4)
Eos: 1 %
Hematocrit: 51.6 % — ABNORMAL HIGH (ref 37.5–51.0)
Hemoglobin: 17.5 g/dL (ref 13.0–17.7)
Immature Grans (Abs): 0.1 10*3/uL (ref 0.0–0.1)
Immature Granulocytes: 1 %
Lymphocytes Absolute: 2.7 10*3/uL (ref 0.7–3.1)
Lymphs: 25 %
MCH: 31.7 pg (ref 26.6–33.0)
MCHC: 33.9 g/dL (ref 31.5–35.7)
MCV: 94 fL (ref 79–97)
Monocytes Absolute: 1.1 10*3/uL — ABNORMAL HIGH (ref 0.1–0.9)
Monocytes: 11 %
Neutrophils Absolute: 6.7 10*3/uL (ref 1.4–7.0)
Neutrophils: 62 %
Platelets: 280 10*3/uL (ref 150–450)
RBC: 5.52 x10E6/uL (ref 4.14–5.80)
RDW: 13.6 % (ref 11.6–15.4)
WBC: 10.8 10*3/uL (ref 3.4–10.8)

## 2024-09-22 LAB — PSA: Prostate Specific Ag, Serum: 1.8 ng/mL (ref 0.0–4.0)

## 2024-09-22 LAB — COMPREHENSIVE METABOLIC PANEL WITH GFR
ALT: 100 [IU]/L — ABNORMAL HIGH (ref 0–44)
AST: 41 [IU]/L — ABNORMAL HIGH (ref 0–40)
Albumin: 4.5 g/dL (ref 3.8–4.9)
Alkaline Phosphatase: 65 [IU]/L (ref 47–123)
BUN/Creatinine Ratio: 16 (ref 9–20)
BUN: 17 mg/dL (ref 6–24)
Bilirubin Total: 0.6 mg/dL (ref 0.0–1.2)
CO2: 22 mmol/L (ref 20–29)
Calcium: 9.7 mg/dL (ref 8.7–10.2)
Chloride: 98 mmol/L (ref 96–106)
Creatinine, Ser: 1.07 mg/dL (ref 0.76–1.27)
Globulin, Total: 2.7 g/dL (ref 1.5–4.5)
Glucose: 62 mg/dL — ABNORMAL LOW (ref 70–99)
Potassium: 4.3 mmol/L (ref 3.5–5.2)
Sodium: 137 mmol/L (ref 134–144)
Total Protein: 7.2 g/dL (ref 6.0–8.5)
eGFR: 80 mL/min/{1.73_m2}

## 2024-09-22 LAB — LIPID PANEL
Chol/HDL Ratio: 3 ratio (ref 0.0–5.0)
Cholesterol, Total: 145 mg/dL (ref 100–199)
HDL: 49 mg/dL
LDL Chol Calc (NIH): 69 mg/dL (ref 0–99)
Triglycerides: 161 mg/dL — ABNORMAL HIGH (ref 0–149)
VLDL Cholesterol Cal: 27 mg/dL (ref 5–40)

## 2024-09-22 LAB — TSH: TSH: 1.88 u[IU]/mL (ref 0.450–4.500)

## 2024-09-22 LAB — HEMOGLOBIN A1C
Est. average glucose Bld gHb Est-mCnc: 120 mg/dL
Hgb A1c MFr Bld: 5.8 % — ABNORMAL HIGH (ref 4.8–5.6)
